# Patient Record
Sex: Female | Born: 1935 | Race: White | Hispanic: No | Smoking: Never smoker
Health system: Southern US, Community
[De-identification: ages and names within clinical notes are randomized; demographics above are authoritative.]

## PROBLEM LIST (undated history)

## (undated) DIAGNOSIS — M1990 Unspecified osteoarthritis, unspecified site: Secondary | ICD-10-CM

## (undated) DIAGNOSIS — C189 Malignant neoplasm of colon, unspecified: Secondary | ICD-10-CM

## (undated) DIAGNOSIS — I10 Essential (primary) hypertension: Secondary | ICD-10-CM

## (undated) HISTORY — PX: COLON SURGERY: SHX602

---

## 2000-07-02 ENCOUNTER — Other Ambulatory Visit: Admission: RE | Admit: 2000-07-02 | Discharge: 2000-07-02 | Payer: Self-pay | Admitting: *Deleted

## 2002-11-10 ENCOUNTER — Inpatient Hospital Stay (HOSPITAL_COMMUNITY): Admission: AD | Admit: 2002-11-10 | Discharge: 2002-11-24 | Payer: Self-pay | Admitting: Internal Medicine

## 2002-11-11 ENCOUNTER — Encounter: Payer: Self-pay | Admitting: Internal Medicine

## 2002-11-13 ENCOUNTER — Encounter: Payer: Self-pay | Admitting: General Surgery

## 2002-11-13 ENCOUNTER — Encounter (INDEPENDENT_AMBULATORY_CARE_PROVIDER_SITE_OTHER): Payer: Self-pay | Admitting: *Deleted

## 2002-11-14 ENCOUNTER — Encounter: Payer: Self-pay | Admitting: General Surgery

## 2002-11-16 ENCOUNTER — Encounter: Payer: Self-pay | Admitting: Internal Medicine

## 2002-11-16 ENCOUNTER — Encounter (INDEPENDENT_AMBULATORY_CARE_PROVIDER_SITE_OTHER): Payer: Self-pay | Admitting: *Deleted

## 2002-11-17 ENCOUNTER — Encounter: Payer: Self-pay | Admitting: Internal Medicine

## 2002-11-24 ENCOUNTER — Inpatient Hospital Stay: Admission: RE | Admit: 2002-11-24 | Discharge: 2002-12-01 | Payer: Self-pay | Admitting: *Deleted

## 2002-11-29 ENCOUNTER — Encounter: Payer: Self-pay | Admitting: Internal Medicine

## 2003-01-07 ENCOUNTER — Encounter: Admission: RE | Admit: 2003-01-07 | Discharge: 2003-01-07 | Payer: Self-pay | Admitting: General Surgery

## 2003-01-07 ENCOUNTER — Encounter: Payer: Self-pay | Admitting: General Surgery

## 2004-08-01 ENCOUNTER — Ambulatory Visit: Payer: Self-pay | Admitting: Hematology & Oncology

## 2005-02-23 ENCOUNTER — Ambulatory Visit: Payer: Self-pay | Admitting: Hematology & Oncology

## 2005-08-24 ENCOUNTER — Ambulatory Visit: Payer: Self-pay | Admitting: Hematology & Oncology

## 2005-10-10 ENCOUNTER — Encounter: Payer: Self-pay | Admitting: General Surgery

## 2006-02-21 ENCOUNTER — Ambulatory Visit: Payer: Self-pay | Admitting: Hematology & Oncology

## 2006-02-25 LAB — CBC WITH DIFFERENTIAL/PLATELET
Eosinophils Absolute: 0.3 10*3/uL (ref 0.0–0.5)
MCV: 90.8 fL (ref 81.0–101.0)
MONO%: 9 % (ref 0.0–13.0)
NEUT#: 4.8 10*3/uL (ref 1.5–6.5)
RBC: 3.99 10*6/uL (ref 3.70–5.32)
RDW: 13.5 % (ref 11.3–14.5)
WBC: 7.5 10*3/uL (ref 3.9–10.0)

## 2006-02-25 LAB — COMPREHENSIVE METABOLIC PANEL
AST: 18 U/L (ref 0–37)
Albumin: 4.2 g/dL (ref 3.5–5.2)
Alkaline Phosphatase: 67 U/L (ref 39–117)
Glucose, Bld: 98 mg/dL (ref 70–99)
Potassium: 4.4 mEq/L (ref 3.5–5.3)
Sodium: 129 mEq/L — ABNORMAL LOW (ref 135–145)
Total Protein: 6.8 g/dL (ref 6.0–8.3)

## 2006-03-19 ENCOUNTER — Ambulatory Visit: Payer: Self-pay | Admitting: Gastroenterology

## 2006-03-26 ENCOUNTER — Ambulatory Visit: Payer: Self-pay | Admitting: Gastroenterology

## 2006-03-28 ENCOUNTER — Ambulatory Visit (HOSPITAL_COMMUNITY): Admission: RE | Admit: 2006-03-28 | Discharge: 2006-03-28 | Payer: Self-pay | Admitting: Gastroenterology

## 2006-08-27 ENCOUNTER — Ambulatory Visit: Payer: Self-pay | Admitting: Hematology & Oncology

## 2006-08-29 LAB — COMPREHENSIVE METABOLIC PANEL
ALT: 9 U/L (ref 0–35)
Alkaline Phosphatase: 52 U/L (ref 39–117)
Sodium: 140 mEq/L (ref 135–145)
Total Bilirubin: 0.5 mg/dL (ref 0.3–1.2)
Total Protein: 6.8 g/dL (ref 6.0–8.3)

## 2006-08-29 LAB — CBC WITH DIFFERENTIAL/PLATELET
BASO%: 0.3 % (ref 0.0–2.0)
LYMPH%: 12.3 % — ABNORMAL LOW (ref 14.0–48.0)
MCH: 29.3 pg (ref 26.0–34.0)
MCHC: 34 g/dL (ref 32.0–36.0)
MCV: 86.2 fL (ref 81.0–101.0)
MONO%: 5.9 % (ref 0.0–13.0)
Platelets: 351 10*3/uL (ref 145–400)
RBC: 4.08 10*6/uL (ref 3.70–5.32)

## 2007-08-26 ENCOUNTER — Ambulatory Visit: Payer: Self-pay | Admitting: Hematology & Oncology

## 2007-08-28 LAB — COMPREHENSIVE METABOLIC PANEL
AST: 15 U/L (ref 0–37)
Albumin: 3.8 g/dL (ref 3.5–5.2)
BUN: 15 mg/dL (ref 6–23)
CO2: 25 mEq/L (ref 19–32)
Calcium: 9.6 mg/dL (ref 8.4–10.5)
Chloride: 97 mEq/L (ref 96–112)
Glucose, Bld: 96 mg/dL (ref 70–99)
Potassium: 4.6 mEq/L (ref 3.5–5.3)

## 2007-08-28 LAB — CBC WITH DIFFERENTIAL/PLATELET
Basophils Absolute: 0 10*3/uL (ref 0.0–0.1)
Eosinophils Absolute: 0.3 10*3/uL (ref 0.0–0.5)
HGB: 9.8 g/dL — ABNORMAL LOW (ref 11.6–15.9)
MONO#: 0.6 10*3/uL (ref 0.1–0.9)
NEUT#: 4.8 10*3/uL (ref 1.5–6.5)
RDW: 13.3 % (ref 11.3–14.5)
lymph#: 1.2 10*3/uL (ref 0.9–3.3)

## 2010-10-27 NOTE — Consult Note (Signed)
NAMEFARRIS, BLASH NO.:  [id number]   MEDICAL RECORD NO.:  [id number]                   PATIENT TYPE:  INP   LOCATION:  5157                                 FACILITY:  [facility]   PHYSICIAN:  Adolph Pollack, M.D.            DATE OF BIRTH:  1921-02-01   DATE OF CONSULTATION:  11/12/2002  DATE OF DISCHARGE:                                   CONSULTATION   REASON FOR VISIT:  Colon mass.   HISTORY OF PRESENT ILLNESS:  Ms. Zynda is a 76 year old female who noticed  a change in her bowel habits about a month ago.  She said she began having  diarrhea and some problems with constipation.  She also had intermittent  abdominal pain.  She took Imodium AD a number of occasions so she could go  out and do the things she likes to do.  She has also had somewhat of a  weight loss and anorexia as well as increase in fatigue.  She is felt to be  severely constipated and outpatient treatment was attempted without success  and she subsequently was admitted as the KUB showed significant increased  stool in the colon.  While in the hospital, she did get some results to her  enemas but then was noted to have a persistent left lower quadrant mass and  a CT scan was done which demonstrated a large mass what appeared to be in  the descending/sigmoid colon region.  Appeared to be partially obstructed.  It was for this reason that I was called to see her.   PAST MEDICAL HISTORY:  1. Hypertension.  2. Peptic ulcer disease.   PREVIOUS OPERATIONS:  1. Tonsillectomy.  2. Adenoidectomy.   ALLERGIES:  1. SULFA MEDICATIONS  2. FLU VACCINE.   CURRENT MEDICATIONS:  1. Tenormin 100 mg daily.  2. Multivitamin.   SOCIAL HISTORY:  She occasionally smoked in the distant past. No alcohol  use.  She is a widow and retired.   FAMILY HISTORY:  Father died from what was felt to be heart disease.  There  is no family history of any type of malignancy.  No diabetes or  hypertension  in the family to her knowledge.   REVIEW OF SYSTEMS:  She denies any known heart problems.  GI:  She has not  had any further problems with peptic ulcer disease.  She was seen by Dr.  Sheryn Bison in the past and had upper and lower endoscopies but this was  somewhere in the late 70s to early 80s.  Constitutional: She has had an  increase in some fatigue and loss of appetite as well as some weight loss.   PHYSICAL EXAMINATION:  GENERAL:  She is an elderly female in no acute  distress.  Very pleasant and cooperative.  HEENT:  Eyes:  Extraocular motions are intact.  No icterus.  SKIN:  Warm and dry without jaundice.  NECK:  Supple without palpable masses or obvious thyroid enlargement.  CARDIOVASCULAR:  Heart demonstrates a regular  rate and rhythm.  RESPIRATORY:  Breath sounds equal and clear.  Respirations nonlabored.  ABDOMEN:  Soft with a palpable mass in the left lower quadrant, slightly  tender. Active bowel sounds noted.  No organomegaly or hernias noted.  EXTREMITIES:  She has severely good muscle tone and no edema.   LABORATORY DATA:  CBC demonstrates a hemoglobin of 9.0, platelet count  459,000. White count 7200, albumin 2.6, rest of liver function tests normal.  CT scan was reviewed which demonstrates the mass in the distal  descending/proximal sigmoid colon region with what appears to be some  adenopathy in the aorta.  There appeared to be some cystic liver lesions as  well.  Contrast does pass through the mass.   IMPRESSION:  Colonic mass, most likely adenocarcinoma.  It is not clearly  characterized as of yet.   PLAN:  I have called [facility] for consultation and for  colonoscopy.  I did discuss the patient the possible need for partial  colectomy plus/minus colostomy.  We went over the procedure and the  rationale for it.  She is not sure she wants to have anything done, but she  is agreeable to having a colonoscopy at this time.  We will  await for the  results of that before we decide and recommend further management.                                                  Adolph Pollack, M.D.    Kari Baars  D:  11/12/2002  T:  11/12/2002  Job:  [id number]   cc:   Venita Lick. Russella Dar, M.D. [facility]   Lilla Shook, M.D.  [address], [address]  [place]  [place]  [address]  Fax: [phone]

## 2010-10-27 NOTE — Op Note (Signed)
Maureen Weiss, Maureen Weiss NO.:  [id number]   MEDICAL RECORD NO.:  [id number]                   PATIENT TYPE:  INP   LOCATION:  2112                                 FACILITY:  [facility]   PHYSICIAN:  Ollen Gross. Vernell Morgans, M.D.              DATE OF BIRTH:  12-04-1920   DATE OF PROCEDURE:  11/16/2002  DATE OF DISCHARGE:                                 OPERATIVE REPORT   PREOPERATIVE DIAGNOSIS:  Obstructing sigmoid colon mass.   POSTOPERATIVE DIAGNOSIS:  Obstructing sigmoid colon mass.   OPERATION PERFORMED:  Exploratory laparotomy, sigmoid colectomy with  mobilization of the splenic flexure.  Bilateral salpingo-oophorectomy and  rigid sigmoidoscopy.   SURGEON:  Ollen Gross. Carolynne Edouard, M.D.   ASSISTANT:  Donnie Coffin. Samuella Cota, M.D.   ANESTHESIA:  General endotracheal.   DESCRIPTION OF PROCEDURE:  After informed consent was obtained, the patient  was brought to the operating room and placed in supine position on the  operating table.  After adequate induction of general anesthesia, the  patient was placed in lithotomy position.  A rigid sigmoidoscopy was  performed and the scope was able to be passed to a depth of 20 cm with no  visualization of tumor or narrowing.  At this point the scope was removed.  The patient was placed back in supine position.  The patient's abdomen was  prepped with Betadine and draped in the usual sterile manner.  A midline  incision was made with a 10 blade knife. This incision was carried down  through the skin and subcutaneous tissue sharply with the electrocautery  until the linea alba was identified.  The linea alba was also incised with  the electrocautery.  The preperitoneal space was then probed bluntly with a  hemostat until the peritoneum was opened and access was gained to the  abdominal cavity.  The rest of the incision was then opened under direct  vision.  The abdomen was inspected.  There was no evidence of peritoneal  metastases or  omental metastases.  The liver was palpated and there were a  few small specks on the liver which on visualization looked like benign  hemangiomas or cysts.  Attention was then turned to the left lower quadrant.  The large tumor could easily be appreciated and palpated involving the  descending sigmoid colon.  This colon was mobilized from its retroperitoneal  attachments by incising along the white line of Toldt.  Part of the  retroperitoneal wall was adhered to the tumor and was excised with the  tumor.  Once this was complete, the tumor itself was mobilized and able to  be brought up into the wound.  There was plenty of distal colon left but the  proximal colon had been mobilized so that it would reach down into the  pelvis for anastomosis.  The incision was carried superiorly a little  farther and then the superior colon  was mobilized from it retroperitoneal  attachments also by incising along the white line of Toldt.  The omentum was  freed.  The splenic flexure was taken down bluntly with finger dissection  and sharply with Bovie dissection.  The omentum was incised.  The omentum  attached to this portion of the colon was separated by serially clamping,  dividing and ligating with 2-0 silk ties.  Once this was all accomplished  and the splenic flexure was mobilized and brought down into the wound, a  site was chosen on the transverse colon for division of the bowel.  The  mesentery was opened sharply with the electrocautery.  A GIA-75 stapler was  placed across the colon, clamped and fired there by dividing the colon  between two sets of staple lines.  The site was then chosen distally a good  margin from the primary tumor for division of the bowel distally and the  mesentery was opened sharply with the electrocautery.  GIA-75 stapler was  placed across the bowel at this point, clamped and fired there by dividing  the bowel between two sets of staple lines.  The mesentery supplying  the  segment of bowel involving the tumor was then taken down by serially  clamping with Kelly clamps dividing and ligating the main vessels of the  mesentery with 2-0 silk ties.  The bigger vessels were doubly ligated with 2-  0 silk ties.  Once this was accomplished, the tumor was able to be removed  from the patient and sent to pathology for further evaluation.  The proximal  and distal segments of colon were easily reapproximated.  They were held in  approximation using a 3-0 silk anchoring stitch, small antimesenteric  enterotomies were made with the Bovie electrocautery.  Each limb of a  stapling device was placed down each limb of the bowel.  The antimesenteric  borders of the bowel were reapproximated this way.  Stapling device was  clamped and fired thereby creating a nice long widely patent  enteroenterostomy.  The enterotomy was closed with interrupted 3-0 silk  stitches.  The mesentery was closed with interrupted 3-0 silk stitches as  well.  The anastomosis appeared to widely patent and intact.  A crotch  stitch with 3-0 silk was also placed to take the tension off the staple  line.  The patient tolerated the procedure well.  At this point gloves were  changed and the abdomen was irrigated with copious amounts of saline.  It  should also be mentioned that during the dissection on the left, the left  tube and ovary was adherent to the tumor and the left tube and ovary were  mobilized along the broad ligament, clamped with Kelly clamps, divided and  then ligated with 2-0 silk suture ligatures and removed with the specimen.  The right tube and ovary were also mobilized along the broad ligament and  then clamped with a Kelly clamp, divided and ligated with 2-0 silk suture  ligatures.  Again, the abdomen was then irrigated with copious amounts of  saline.  There appeared to be some clot in the left upper quadrant and the left upper quadrant was inspected again.  There appeared to be  a small  bleeding vessel along the peritoneal edge which was controlled with large  hemostatic clips and some interrupted 3-0 silk suture ligatures.  Once this  was accomplished, the left upper quadrant was inspected again and found to  be hemostatic.  The fascia of the  anterior abdominal was closed with two  running #1 PDS sutures.  The subcutaneous tissue was irrigated with saline  and the skin was closed with staples.  The patient tolerated the procedure  well. At the end of the case all sponge, needle and instrument counts were  correct.  The patient was then awakened and taken to recovery room in stable  condition.                                                Ollen Gross. Vernell Morgans, M.D.    PST/MEDQ  D:  11/17/2002  T:  11/17/2002  Job:  [id number]

## 2010-10-27 NOTE — Discharge Summary (Signed)
NAMEPEARLE, WANDLER NO.:  [id number]   MEDICAL RECORD NO.:  [id number]                   PATIENT TYPE:  INP   LOCATION:  5733                                 FACILITY:  [facility]   PHYSICIAN:  Lilla Shook, M.D.            DATE OF BIRTH:  June 03, 1921   DATE OF ADMISSION:  11/10/2002  DATE OF DISCHARGE:  11/24/2002                                 DISCHARGE SUMMARY   DISCHARGE DIAGNOSES:  1. Constipation.  2. Obstructing sigmoid colonic mass.  Pathology showed invasive     adenocarcinoma extending beneath muscularis propria into subserosal     tissue, 18 lymph nodes negative for metastatic carcinoma, and there was     no definitive lymphovascular invasion identified.  3. Hypertension.   DISCHARGE MEDICATIONS:  1. Atenolol 100 mg daily.  2. Vicodin 5/500 mg q.4-6h. p.r.n. (1 or 1 tablets).  3. Tylenol 650 mg p.r.n./q.6h. p.r.n.  4. Zofran 2 to 4 mg IV q.2-3h. p.r.n.  5. Labetalol 10 mg IV q.4h. p.r.n.  6. Lisinopril 10 mg p.o. daily.  7. __________ p.r.n.   CHIEF COMPLAINT:  Constipation.   HISTORY OF PRESENT ILLNESS:  The patient is a pleasant 77 year old woman  with a past medical history of hypertension.  She presented with complaints  of constipation x 30 days.  She states she had diarrhea for two to three  weeks and had no solid stools, no fevers or chills.  She has taken Imodium  AD to help on several occasions.  She had abdominal pain, nausea, anorexia  and evidently had decreased in weight.  I saw her at least once in my office  previously for this.  She lost 5 pounds in the past four days.  She was  taking outpatient therapy with MiraLax but without good results and also did  not take the Lactulose given to her.  She performed two Fleet's enemas in  the past two to three days.  She was admitted, was impacted, and will need  further workup.  She had a KUB Nov 06, 2002, showing increased amount of  stool.   PAST MEDICAL HISTORY:   Hypertension.   ALLERGIES:  SULFA and FLU VACCINE.   PHYSICAL EXAMINATION:  VITAL SIGNS:  Temperature 98, blood pressure 110/62,  pulse 76, weight 119(she was 124 four days earlier).  GENERAL:  She was in mild distress, holding her abdomen.  LUNGS:  Clear bilaterally.  CARDIOVASCULAR:  Normal.  ABDOMEN:  Flat with good bowel sounds and soft.  Several masses of what was  felt to be stool in left upper quadrant.  No tenderness, no distention, no  rebound or guarding.  RECTAL:  She had brown stool in the rectal vault that was heme negative.  EXTREMITIES:  Normal.  NEUROLOGIC:  Normal.   LABORATORY DATA:  Sodium 136, potassium 3.4, glucose 207, BUN 10, creatinine  0.9, total protein 5.8, albumin 2.6.  LFTs  were normal.   HOSPITAL COURSE:  Ms. Bordner was admitted and placed on full liquid diet  until she had a bowel movement.  She was given IV fluids, Fleet's enema  preps and soap suds enemas.  A CT scan of her abdomen was performed showing  a large hiatal hernia, several small lesions on the liver some of which  appeared cystic.  There was no bowel obstruction, no adenopathy, but a large  soft tissue mass in the left pelvis emanating from the juncture of the  sigmoid and descending colon.  There was newly obstructive flow at that  level to contrast.  The mass was 5.5 x 7.9 cm.  There was an adjacent  necrotic node laterally without perforation of free fluid.  The GI service  was consulted, and Dr. Russella Dar performed a colonoscopy on Nov 13, 2002.  This  was remarkable for diverticulosis and almost completely obstructed sigmoid  colon with apple core lesion.  Dr. Abbey Chatters was consulted and MRI was  obtained to evaluate for possible metastasis.  MRI was negative for this.  CEA level was normal.   She consented for surgery on November 16, 2002.  Had a sigmoid colectomy with  mobilization of the splenic flexure.  Bilateral salpingo-oophorectomy, and  rigid sigmoidoscopy were also performed by  Dr. Chevis Pretty.   Her postoperative course was remarkable only for reintubation likely because  she was very drowsy.  She was promptly extubated the next day as well.  She  moved from intensive care to the regular floor.  She developed a  temperature, and Zosyn was initially started because of question of basilar  infiltrates, but on discontinuation, she did well.   Dr. Filbert Berthold was consulted for an oncology opinion.  She did not need adjuvant  chemotherapy was recommendation.  She needed colonoscopy within one year of  surgery, CT of abdomen every 6 months x 4 and every year x 3 with LFTs and  CEA performed each time.   Ms. Tolliver was transferred to [facility] shortly after her surgery.  She tolerated  Lisinopril in addition to Tenormin for blood pressure.   CONDITION ON DISCHARGE:  Stable and improved.   CONSULTATIONS:  1. Adolph Pollack, M.D.  2. Ollen Gross. Carolynne Edouard, M.D.  3. Enzo Montgomery. Filbert Berthold, M.D.                                                Lilla Shook, M.D.    SEJ/MEDQ  D:  01/07/2003  T:  01/07/2003  Job:  [id number]

## 2010-10-27 NOTE — Assessment & Plan Note (Signed)
[facility]                           GASTROENTEROLOGY OFFICE NOTE   GEORGENA, Weiss                       MRN:          [id number]  DATE:03/19/2006                            DOB:          10-22-1920    REFERRING PHYSICIAN:  Rose Phi. Myna Hidalgo, M.D.   REASON FOR REFERRAL:  Personal history of colon cancer.  Maureen Weiss is an  77 year old white female whom I have previously evaluated.  She underwent a  colonoscopy in June of 2004 for anemia and an abnormal CT scan suspicious  for sigmoid colon mass.  An obstructing adenocarcinoma of the sigmoid colon  was noted, and she underwent a sigmoid colectomy by Dr. Carolynne Edouard on November 16, 2002.  Final pathology was a T3N0MX adenocarcinoma of the sigmoid colon.  No  adjuvant chemotherapy was given.  She has had ongoing followup with Dr.  Myna Hidalgo and Dr. Kirby Funk.  I have an office note from Dr. Myna Hidalgo dated  August 27, 2005, and her disease was felt to be stable at that point, and  adjuvant chemotherapy was deferred.  She states she has had a recent checkup  with Dr. Myna Hidalgo and Dr. Valentina Lucks, and she was encouraged to keep her  recommended appointment for colonoscopy.  They did send a letter to her in  2006, at 2 years following her colonoscopy, to recommend she have a repeat  colonoscopy, but she did not return.  She states she did receive the letter.  She has been encouraged by Dr. Myna Hidalgo and Dr. Valentina Lucks to proceed with  colonoscopy at this time.  She has no abdominal complaints whatsoever and  specifically denies any abdominal pain, weight loss, change in bowel habits,  change in stool caliber, melena, or hematochezia.   PAST MEDICAL HISTORY:  T3N0MX adenocarcinoma of the sigmoid colon status  post sigmoid colectomy, status post bilateral salpingo-oophorectomy,  hypertension.   CURRENT MEDICATIONS:  1. Tenormin 100 mg q. day.  2. Hydrochlorothiazide 12.5 mg q. day.  3. Multivitamin q. day.  4. Tylenol  p.r.n.   MEDICATION ALLERGIES:  SULFA DRUGS.   SOCIAL HISTORY:  She is widowed with 1 child.  She lives by herself.  Her  son resides in [place].  She denies alcohol and tobacco product usage.   REVIEW OF SYSTEMS:  Entirely negative.   PHYSICAL EXAM:  No acute distress.  Height 5 feet 3 inches, weight 127 pounds.  Blood pressure is 140/80, pulse  72 and regular.  HEENT:  Anicteric sclerae.  Oropharynx clear.  CHEST:  Clear to auscultation bilaterally.  CARDIAC:  Regular rate and rhythm without murmurs appreciated.  ABDOMEN:  Soft and nontender.  Nondistended.  Normoactive bowel sounds.  No  palpable organomegaly, masses, or hernias.  RECTAL:  Examination deferred to time of colonoscopy.  EXTREMITIES:  Without cyanosis, clubbing, or edema.  NEUROLOGIC:  Alert and oriented x3.  Grossly nonfocal.   ASSESSMENT AND PLAN:  Personal history of sigmoid colon adenocarcinoma  status post sigmoid colectomy in June of 2004.  Need to exclude recurrent  colorectal neoplasms.  Risks, benefits, and alternatives to colonoscopy with  possible biopsy and possible polypectomy discussed with the patient.  She  consents to proceed.  This will be scheduled electively.  She clearly states  this will be her last colonoscopy.  Ongoing followup with Dr. Myna Hidalgo and  Dr. Valentina Lucks.       Maureen Weiss. Russella Dar, MD, Clementeen Graham      MTS/MedQ  DD:  03/19/2006  DT:  03/21/2006  Job #:  [id number]   cc:   Thora Lance, M.D.  Rose Phi. Myna Hidalgo, M.D.

## 2011-09-08 ENCOUNTER — Emergency Department (HOSPITAL_COMMUNITY): Payer: Medicare Other

## 2011-09-08 ENCOUNTER — Emergency Department (HOSPITAL_COMMUNITY)
Admission: EM | Admit: 2011-09-08 | Discharge: 2011-09-08 | Disposition: A | Discharge status: Home / Self Care | Payer: Medicare Other | Attending: Emergency Medicine | Admitting: Emergency Medicine

## 2011-09-08 ENCOUNTER — Encounter (HOSPITAL_COMMUNITY): Payer: Self-pay

## 2011-09-08 DIAGNOSIS — M1610 Unilateral primary osteoarthritis, unspecified hip: Secondary | ICD-10-CM | POA: Insufficient documentation

## 2011-09-08 DIAGNOSIS — S329XXA Fracture of unspecified parts of lumbosacral spine and pelvis, initial encounter for closed fracture: Secondary | ICD-10-CM | POA: Insufficient documentation

## 2011-09-08 DIAGNOSIS — R0602 Shortness of breath: Secondary | ICD-10-CM | POA: Insufficient documentation

## 2011-09-08 DIAGNOSIS — R109 Unspecified abdominal pain: Secondary | ICD-10-CM | POA: Insufficient documentation

## 2011-09-08 DIAGNOSIS — M25459 Effusion, unspecified hip: Secondary | ICD-10-CM | POA: Insufficient documentation

## 2011-09-08 DIAGNOSIS — X58XXXA Exposure to other specified factors, initial encounter: Secondary | ICD-10-CM | POA: Insufficient documentation

## 2011-09-08 DIAGNOSIS — M169 Osteoarthritis of hip, unspecified: Secondary | ICD-10-CM | POA: Insufficient documentation

## 2011-09-08 DIAGNOSIS — R079 Chest pain, unspecified: Secondary | ICD-10-CM | POA: Insufficient documentation

## 2011-09-08 DIAGNOSIS — I10 Essential (primary) hypertension: Secondary | ICD-10-CM | POA: Insufficient documentation

## 2011-09-08 HISTORY — DX: Malignant neoplasm of colon, unspecified: C18.9

## 2011-09-08 HISTORY — DX: Essential (primary) hypertension: I10

## 2011-09-08 HISTORY — DX: Unspecified osteoarthritis, unspecified site: M19.90

## 2011-09-08 MED ORDER — HYDROCODONE-ACETAMINOPHEN 5-325 MG PO TABS: 1.0000 | ORAL_TABLET | Freq: Four times a day (QID) | ORAL | Status: DC | PRN

## 2011-09-08 MED ORDER — HYDROCODONE-ACETAMINOPHEN 5-325 MG PO TABS: 1.0000 | ORAL_TABLET | Freq: Four times a day (QID) | ORAL | Status: AC | PRN

## 2011-09-08 NOTE — ED Notes (Signed)
Pt moved to [facility] , waiting for PTAR

## 2011-09-08 NOTE — ED Provider Notes (Addendum)
History     CSN: [id number]  Arrival date & time 09/08/11  1037   First MD Initiated Contact with Patient 09/08/11 1157      Chief Complaint  Patient presents with  . Groin Pain    (Consider location/radiation/quality/duration/timing/severity/associated sxs/prior treatment) Patient is a 78 y.o. female presenting with hip pain. The history is provided by the patient and the spouse.  Hip Pain This is a new problem. The problem occurs constantly. The problem has not changed since onset.Associated symptoms include chest pain. Pertinent negatives include no abdominal pain, no headaches and no shortness of breath. The symptoms are aggravated by nothing. The symptoms are relieved by nothing.   patient with fall back in February seen by her primary care provider on March 6 had x-rays of the hip which was negative today with increased right groin pain no new fall. Patient also did hit her chest with her cane and couple days ago and has some midsternal tenderness to the chest. No other complaints.  Groin pain is listed currently as about an 8/10 at its worse with walking. It does not radiate. Described as sharp. Past Medical History  Diagnosis Date  . Hypertension   . Osteoarthritis   . Colon cancer     Past Surgical History  Procedure Date  . Colon surgery     No family history on file.  History  Substance Use Topics  . Smoking status: Never Smoker   . Smokeless tobacco: Not on file  . Alcohol Use: No    OB History    Grav Para Term Preterm Abortions TAB SAB Ect Mult Living                  Review of Systems  Constitutional: Negative for fever and chills.  HENT: Negative for neck pain.   Eyes: Negative for visual disturbance.  Respiratory: Negative for shortness of breath.   Cardiovascular: Positive for chest pain.  Gastrointestinal: Negative for nausea, vomiting, abdominal pain and diarrhea.  Genitourinary: Negative for dysuria.  Musculoskeletal: Negative for back  pain.  Skin: Negative for rash.  Neurological: Negative for weakness and headaches.  Hematological: Does not bruise/bleed easily.    Allergies  Sulfa antibiotics  Home Medications   Current Outpatient Rx  Name Route Sig Dispense Refill  . AMLODIPINE BESYLATE 2.5 MG PO TABS Oral Take 2.5 mg by mouth daily.    . ATENOLOL 100 MG PO TABS Oral Take 100 mg by mouth daily.    Marland Kitchen HYDROCODONE-ACETAMINOPHEN 5-325 MG PO TABS Oral Take 1 tablet by mouth every 6 (six) hours as needed.    . ADULT MULTIVITAMIN W/MINERALS CH Oral Take 1 tablet by mouth daily.    Marland Kitchen HYDROCODONE-ACETAMINOPHEN 5-325 MG PO TABS Oral Take 1-2 tablets by mouth every 6 (six) hours as needed for pain. 14 tablet 0    BP 164/66  Pulse 74  Temp(Src) 99.2 F (37.3 C) (Oral)  Resp 18  SpO2 95%  Physical Exam  Nursing note and vitals reviewed. Constitutional: She is oriented to person, place, and time. She appears well-developed and well-nourished. No distress.  HENT:  Head: Normocephalic and atraumatic.  Mouth/Throat: Oropharynx is clear and moist.  Eyes: Conjunctivae are normal. Pupils are equal, round, and reactive to light.  Neck: Normal range of motion. Neck supple.  Cardiovascular: Normal rate, regular rhythm, normal heart sounds and intact distal pulses.   No murmur heard. Pulmonary/Chest: Effort normal and breath sounds normal. No respiratory distress.  Abdominal: Soft. Bowel  sounds are normal. There is no tenderness.  Musculoskeletal: Normal range of motion. She exhibits no edema and no tenderness.       No deformity to function 2+ dorsalis pedis pulse. the right hip no leg shortening pain with range of motion passively good distal neural  Neurological: She is alert and oriented to person, place, and time. No cranial nerve deficit. She exhibits normal muscle tone. Coordination normal.  Skin: Skin is warm. No rash noted.    ED Course  Procedures (including critical care time)  Labs Reviewed - No data to  display Dg Chest 2 View  09/08/2011  *RADIOLOGY REPORT*  Clinical Data: Shortness of breath  CHEST - 2 VIEW  Comparison: None  Findings: The heart size appears mildly enlarged.  There are small bilateral pleural effusions and pulmonary venous congestion.  No overt edema.  No airspace consolidation is identified.  The patient has a hiatal hernia.  Lung volumes are low and there is asymmetric elevation of the right hemidiaphragm.  IMPRESSION: 1.  Small effusions and pulmonary venous congestion. 2.  Hiatal hernia.  Original Report Authenticated By: Rosealee Albee, M.D.   Ct Hip Right Wo Contrast  09/08/2011  *RADIOLOGY REPORT*  Clinical Data: All March 6.  Sudden onset right groin pain yesterday.  Limited range of motion of the right leg.  CT OF THE RIGHT HIP WITHOUT CONTRAST  Technique:  Multidetector CT imaging was performed according to the standard protocol. Multiplanar CT image reconstructions were also generated.  Comparison: None.  Findings: There is a nondisplaced comminuted fracture of the anterior right inferior pubic ramus.  The fracture plane extends into the parasymphyseal pubis.  There is mild associated hematoma and edema extending into the adductor compartment.  The root of the right superior pubic ramus appears intact.  Moderate right hip osteoarthritis is present with acetabular subchondral cyst.  The right sacrum is incompletely visualized.  At the superior margin of the scan, there appears to be a right sacral ala fracture that would complement the pubic bone fracture.  Swelling of the right proximal adductor musculature is suspected, compatible with intramuscular hematoma.  Iliofemoral atherosclerosis is noted. Calcified phleboliths are present in the anatomic pelvis.  The femoral neck appears intact.  Common hamstring origins appear normal.  IMPRESSION: 1.  Comminuted nondisplaced right pubic bone fracture extending from parasymphyseal pubis into the anterior right inferior pubic ramus. 2.   Partially visualized probable right sacral ala fracture seen at the margin of the scan.  This would this would complement the right pubic fracture. 3.  Moderate right hip osteoarthritis.  Small right hip effusion.  Original Report Authenticated By: Andreas Newport, M.D.     1. Pelvic fracture       MDM  CT scan reveals a right pelvic fracture no hip fracture. Assume that this occurred with a fall that happened back in the end of February do not think this is acute. But either way of treatment will be pain control and followup patient is able to ambulate with this.        Shelda Jakes, MD 09/08/11 1529  Shelda Jakes, MD 09/08/11 [phone]

## 2011-09-08 NOTE — ED Notes (Signed)
MVH:[id number]<GE> Expected date:09/08/11<BR> Expected time:10:33 AM<BR> Means of arrival:Ambulance<BR> Comments:<BR> S/p fall

## 2011-09-08 NOTE — ED Notes (Signed)
Per ems, pt fell on March 6th, went to PCP and xray were done, sts everything was normal. Haven't been experiencing any symptoms. Pt came today c/o sudden right groin pain, onset yesterday, denied any injury to the area. Limited ROM on the right leg, right groin area tender to touch.

## 2011-09-08 NOTE — ED Notes (Signed)
Pt will be going home by [facility]. [facility] called

## 2011-09-08 NOTE — ED Notes (Signed)
[facility] AT BEDSIDE. 

## 2011-09-08 NOTE — Discharge Instructions (Signed)
Pelvic Fracture  Pelvic fractures are usually due to a fall or car accident. Minor fractures of the pelvic bones can often be treated at home. Walking or changing positions may be painful. You should rest for several days but then begin weight bearing and walking as tolerated. Crutches or a walker are often used in the first few weeks to reduce pain and enable you to get around easier. Prolonged bed rest for a pelvic fracture is not recommended. It increases your risk for blood clots and other complications.  Pelvic fractures usually heal in 6-12 weeks without any special treatment. Treatment may include pain medicine, medicine to keep your stool soft, and crutches or a walker.   Take these simple measures to avoid further falls and injuries:   Get rid of your throw rugs and electrical cords from traveled areas.   Avoid poorly lit stairs.   Do not wear high heel shoes.  If you are at risk for osteoporosis, treatment includes regular exercise, a diet rich in calcium and vitamin D, and possibly other drugs to help preserve bone. Ask your primary care physician if you should have a bone density test (DEXA scan) if you are 50 years or older.  SEEK IMMEDIATE MEDICAL CARE IF:  You develop blood in your urine, increased pain, severe constipation, increasing difficulty walking, pain or unusual swelling in your legs, chest pain, fever, shortness of breath, or if you faint or fall.   MAKE SURE YOU:    Understand these instructions.   Will watch your condition.   Will get help right away if you are not doing well or get worse.  Document Released: 07/05/2004 Document Revised: 05/17/2011 Document Reviewed: 09/15/2008  [employer] Patient Information 2012 [employer]

## 2011-09-12 ENCOUNTER — Inpatient Hospital Stay (HOSPITAL_COMMUNITY)
Admission: EM | Admit: 2011-09-12 | Discharge: 2011-09-17 | DRG: 536 | Disposition: A | Discharge status: Skilled Nursing Facility | Payer: Medicare Other | Attending: Internal Medicine | Admitting: Internal Medicine

## 2011-09-12 ENCOUNTER — Other Ambulatory Visit: Payer: Self-pay

## 2011-09-12 ENCOUNTER — Encounter (HOSPITAL_COMMUNITY): Payer: Self-pay

## 2011-09-12 DIAGNOSIS — D509 Iron deficiency anemia, unspecified: Secondary | ICD-10-CM | POA: Diagnosis present

## 2011-09-12 DIAGNOSIS — L89302 Pressure ulcer of unspecified buttock, stage 2: Secondary | ICD-10-CM

## 2011-09-12 DIAGNOSIS — W19XXXA Unspecified fall, initial encounter: Secondary | ICD-10-CM

## 2011-09-12 DIAGNOSIS — Z85038 Personal history of other malignant neoplasm of large intestine: Secondary | ICD-10-CM

## 2011-09-12 DIAGNOSIS — I10 Essential (primary) hypertension: Secondary | ICD-10-CM | POA: Diagnosis present

## 2011-09-12 DIAGNOSIS — S3210XA Unspecified fracture of sacrum, initial encounter for closed fracture: Secondary | ICD-10-CM | POA: Diagnosis present

## 2011-09-12 DIAGNOSIS — L98499 Non-pressure chronic ulcer of skin of other sites with unspecified severity: Secondary | ICD-10-CM | POA: Diagnosis present

## 2011-09-12 DIAGNOSIS — Z888 Allergy status to other drugs, medicaments and biological substances status: Secondary | ICD-10-CM

## 2011-09-12 DIAGNOSIS — R52 Pain, unspecified: Secondary | ICD-10-CM

## 2011-09-12 DIAGNOSIS — Z79899 Other long term (current) drug therapy: Secondary | ICD-10-CM

## 2011-09-12 DIAGNOSIS — Z882 Allergy status to sulfonamides status: Secondary | ICD-10-CM

## 2011-09-12 DIAGNOSIS — K5900 Constipation, unspecified: Secondary | ICD-10-CM | POA: Diagnosis present

## 2011-09-12 DIAGNOSIS — M1990 Unspecified osteoarthritis, unspecified site: Secondary | ICD-10-CM | POA: Diagnosis present

## 2011-09-12 DIAGNOSIS — L89309 Pressure ulcer of unspecified buttock, unspecified stage: Secondary | ICD-10-CM

## 2011-09-12 DIAGNOSIS — S32509A Unspecified fracture of unspecified pubis, initial encounter for closed fracture: Principal | ICD-10-CM

## 2011-09-12 LAB — CBC
HCT: 33.2 % — ABNORMAL LOW (ref 36.0–46.0)
HCT: 32.7 % — ABNORMAL LOW (ref 36.0–46.0)
Hemoglobin: 10.1 g/dL — ABNORMAL LOW (ref 12.0–15.0)
Hemoglobin: 10.2 g/dL — ABNORMAL LOW (ref 12.0–15.0)
MCH: 25.2 pg — ABNORMAL LOW (ref 26.0–34.0)
MCHC: 30.4 g/dL (ref 30.0–36.0)
MCHC: 31.2 g/dL (ref 30.0–36.0)
MCV: 81.2 fL (ref 78.0–100.0)
MCV: 80.9 fL (ref 78.0–100.0)
RBC: 4.04 MIL/uL (ref 3.87–5.11)
RDW: 16.8 % — ABNORMAL HIGH (ref 11.5–15.5)
WBC: 9 10*3/uL (ref 4.0–10.5)

## 2011-09-12 LAB — URINALYSIS, ROUTINE W REFLEX MICROSCOPIC
Bilirubin Urine: NEGATIVE
Hgb urine dipstick: NEGATIVE
Ketones, ur: NEGATIVE mg/dL
Nitrite: NEGATIVE
pH: 7 (ref 5.0–8.0)

## 2011-09-12 LAB — DIFFERENTIAL
Basophils Absolute: 0 10*3/uL (ref 0.0–0.1)
Eosinophils Relative: 3 % (ref 0–5)
Lymphocytes Relative: 18 % (ref 12–46)
Monocytes Absolute: 0.6 10*3/uL (ref 0.1–1.0)
Monocytes Relative: 7 % (ref 3–12)

## 2011-09-12 LAB — POCT I-STAT, CHEM 8
BUN: 12 mg/dL (ref 6–23)
Chloride: 105 mEq/L (ref 96–112)
Creatinine, Ser: 0.7 mg/dL (ref 0.50–1.10)
Glucose, Bld: 103 mg/dL — ABNORMAL HIGH (ref 70–99)
Hemoglobin: 11.6 g/dL — ABNORMAL LOW (ref 12.0–15.0)
Potassium: 4.4 mEq/L (ref 3.5–5.1)
Sodium: 138 mEq/L (ref 135–145)

## 2011-09-12 LAB — CREATININE, SERUM: Creatinine, Ser: 0.55 mg/dL (ref 0.50–1.10)

## 2011-09-12 MED ORDER — AMLODIPINE BESYLATE 2.5 MG PO TABS
2.5000 mg | ORAL_TABLET | Freq: Every day | ORAL | Status: DC
  Administered 2011-09-13 – 2011-09-17 (×5): 2.5 mg via ORAL
  Filled 2011-09-12 (×5): qty 1

## 2011-09-12 MED ORDER — HYDROMORPHONE HCL PF 1 MG/ML IJ SOLN
1.0000 mg | Freq: Once | INTRAMUSCULAR | Status: AC
  Administered 2011-09-12: 1 mg via INTRAVENOUS
  Filled 2011-09-12: qty 1

## 2011-09-12 MED ORDER — ADULT MULTIVITAMIN W/MINERALS CH
1.0000 | ORAL_TABLET | Freq: Every day | ORAL | Status: DC
Start: 2011-09-13 — End: 2011-09-17
  Administered 2011-09-13 – 2011-09-17 (×5): 1 via ORAL
  Filled 2011-09-12 (×5): qty 1

## 2011-09-12 MED ORDER — BISACODYL 5 MG PO TBEC
5.0000 mg | DELAYED_RELEASE_TABLET | Freq: Every day | ORAL | Status: DC | PRN
  Filled 2011-09-12: qty 1

## 2011-09-12 MED ORDER — ONDANSETRON HCL 4 MG/2ML IJ SOLN: 4.0000 mg | Freq: Four times a day (QID) | INTRAMUSCULAR | Status: DC | PRN

## 2011-09-12 MED ORDER — ATENOLOL 100 MG PO TABS
100.0000 mg | ORAL_TABLET | Freq: Every day | ORAL | Status: DC
  Administered 2011-09-13 – 2011-09-17 (×5): 100 mg via ORAL
  Filled 2011-09-12 (×5): qty 1

## 2011-09-12 MED ORDER — ONDANSETRON HCL 4 MG/2ML IJ SOLN
4.0000 mg | Freq: Once | INTRAMUSCULAR | Status: AC
  Administered 2011-09-12: 4 mg via INTRAVENOUS
  Filled 2011-09-12: qty 2

## 2011-09-12 MED ORDER — HYDROMORPHONE HCL PF 1 MG/ML IJ SOLN
1.0000 mg | INTRAMUSCULAR | Status: DC | PRN
  Administered 2011-09-12: 1 mg via INTRAVENOUS
  Filled 2011-09-12: qty 1

## 2011-09-12 MED ORDER — SODIUM CHLORIDE 0.9 % IV SOLN
INTRAVENOUS | Status: DC
  Administered 2011-09-12 – 2011-09-13 (×3): via INTRAVENOUS

## 2011-09-12 MED ORDER — ONDANSETRON HCL 4 MG PO TABS: 4.0000 mg | ORAL_TABLET | Freq: Four times a day (QID) | ORAL | Status: DC | PRN

## 2011-09-12 MED ORDER — POLYETHYLENE GLYCOL 3350 17 G PO PACK
17.0000 g | PACK | Freq: Every day | ORAL | Status: DC | PRN
  Filled 2011-09-12: qty 1

## 2011-09-12 MED ORDER — HEPARIN SODIUM (PORCINE) 5000 UNIT/ML IJ SOLN
5000.0000 [IU] | Freq: Three times a day (TID) | INTRAMUSCULAR | Status: DC
  Administered 2011-09-12 – 2011-09-17 (×14): 5000 [IU] via SUBCUTANEOUS
  Filled 2011-09-12 (×17): qty 1

## 2011-09-12 MED ORDER — HYDROCODONE-ACETAMINOPHEN 5-325 MG PO TABS
1.0000 | ORAL_TABLET | Freq: Four times a day (QID) | ORAL | Status: DC | PRN
  Administered 2011-09-13 – 2011-09-14 (×3): 1 via ORAL
  Administered 2011-09-14 – 2011-09-17 (×6): 2 via ORAL
  Filled 2011-09-12: qty 1
  Filled 2011-09-12: qty 2
  Filled 2011-09-12: qty 1
  Filled 2011-09-12 (×3): qty 2
  Filled 2011-09-12: qty 1
  Filled 2011-09-12 (×2): qty 2

## 2011-09-12 NOTE — ED Notes (Signed)
[id number] Ready 

## 2011-09-12 NOTE — ED Provider Notes (Signed)
History     CSN: [id number]  Arrival date & time 09/12/11  1232   First MD Initiated Contact with Patient 09/12/11 1357      Chief Complaint  Patient presents with  . Pressure Ulcer    (Consider location/radiation/quality/duration/timing/severity/associated sxs/prior treatment) HPI Comments: Patient here with continued right groin pain s/p fall with pubic rami fractures - patient normally lives at home by herself, is very active but her son reports that since the fall, she has to have help getting out of the bed and does not get up unless absolutely necessary - he states that now the patient has multiple small pressure ulcers to bilateral sacral areas because of the stasis.  Patient states that even with the pain medication she cannot take the pain.  She was seen earlier today by her PCP, Dr. Kirby Funk, who sent her here for admission to the hospital for pain control and then for placement for rehab.  Patient is a 78 y.o. female presenting with hip pain. The history is provided by the patient. No language interpreter was used.  Hip Pain This is a new problem. The current episode started in the past 7 days. The problem occurs constantly. The problem has been unchanged. Associated symptoms include arthralgias and weakness. Pertinent negatives include no abdominal pain, anorexia, change in bowel habit, chest pain, chills, congestion, coughing, diaphoresis, fatigue, fever, headaches, joint swelling, myalgias, nausea, neck pain, numbness, rash, sore throat, swollen glands, urinary symptoms, vertigo, visual change or vomiting. The symptoms are aggravated by nothing. She has tried nothing for the symptoms. The treatment provided no relief.    Past Medical History  Diagnosis Date  . Hypertension   . Osteoarthritis   . Colon cancer     Past Surgical History  Procedure Date  . Colon surgery     No family history on file.  History  Substance Use Topics  . Smoking status: Never Smoker     . Smokeless tobacco: Not on file  . Alcohol Use: No    OB History    Grav Para Term Preterm Abortions TAB SAB Ect Mult Living                  Review of Systems  Constitutional: Negative for fever, chills, diaphoresis and fatigue.  HENT: Negative for congestion, sore throat and neck pain.   Respiratory: Negative for cough.   Cardiovascular: Negative for chest pain.  Gastrointestinal: Negative for nausea, vomiting, abdominal pain, anorexia and change in bowel habit.  Musculoskeletal: Positive for arthralgias. Negative for myalgias and joint swelling.  Skin: Negative for rash.  Neurological: Positive for weakness. Negative for vertigo, numbness and headaches.  All other systems reviewed and are negative.    Allergies  Neosporin and Sulfa antibiotics  Home Medications   Current Outpatient Rx  Name Route Sig Dispense Refill  . AMLODIPINE BESYLATE 2.5 MG PO TABS Oral Take 2.5 mg by mouth daily.    . ATENOLOL 100 MG PO TABS Oral Take 100 mg by mouth daily.    Marland Kitchen HYDROCODONE-ACETAMINOPHEN 5-325 MG PO TABS Oral Take 1-2 tablets by mouth every 6 (six) hours as needed for pain. 14 tablet 0  . ADULT MULTIVITAMIN W/MINERALS CH Oral Take 1 tablet by mouth daily.      BP 118/75  Pulse 91  Temp(Src) 99.8 F (37.7 C) (Oral)  Resp 18  SpO2 96%  Physical Exam  Nursing note and vitals reviewed. Constitutional: She is oriented to person, place, and time. She  appears well-developed and well-nourished. No distress.  HENT:  Head: Normocephalic and atraumatic.  Right Ear: External ear normal.  Left Ear: External ear normal.  Nose: Nose normal.  Mouth/Throat: Oropharynx is clear and moist. No oropharyngeal exudate.  Eyes: Conjunctivae are normal. Pupils are equal, round, and reactive to light. No scleral icterus.  Neck: Normal range of motion. Neck supple.  Cardiovascular: Normal rate, regular rhythm and normal heart sounds.  Exam reveals no gallop and no friction rub.   No murmur  heard. Pulmonary/Chest: Effort normal and breath sounds normal. No respiratory distress. She has no wheezes. She has no rales. She exhibits no tenderness.  Abdominal: Soft. Bowel sounds are normal. She exhibits no distension. There is no tenderness.  Musculoskeletal:       Right hip: She exhibits decreased range of motion, tenderness and bony tenderness.       Lumbar back: She exhibits decreased range of motion, tenderness and bony tenderness. She exhibits no deformity.       Back:       Legs: Lymphadenopathy:    She has no cervical adenopathy.  Neurological: She is alert and oriented to person, place, and time. No cranial nerve deficit.  Skin: Skin is warm and dry. Rash noted. There is erythema.     Psychiatric: She has a normal mood and affect. Her behavior is normal. Judgment and thought content normal.    ED Course  Procedures (including critical care time)   Labs Reviewed  CBC  DIFFERENTIAL  URINALYSIS, ROUTINE W REFLEX MICROSCOPIC   No results found. Results for orders placed during the hospital encounter of 09/12/11  CBC      Component Value Range   WBC 9.0  4.0 - 10.5 (K/uL)   RBC 4.09  3.87 - 5.11 (MIL/uL)   Hemoglobin 10.1 (*) 12.0 - 15.0 (g/dL)   HCT 78.2 (*) 95.6 - 46.0 (%)   MCV 81.2  78.0 - 100.0 (fL)   MCH 24.7 (*) 26.0 - 34.0 (pg)   MCHC 30.4  30.0 - 36.0 (g/dL)   RDW 21.3 (*) 08.6 - 15.5 (%)   Platelets 482 (*) 150 - 400 (K/uL)  DIFFERENTIAL      Component Value Range   Neutrophils Relative 72  43 - 77 (%)   Neutro Abs 6.4  1.7 - 7.7 (K/uL)   Lymphocytes Relative 18  12 - 46 (%)   Lymphs Abs 1.6  0.7 - 4.0 (K/uL)   Monocytes Relative 7  3 - 12 (%)   Monocytes Absolute 0.6  0.1 - 1.0 (K/uL)   Eosinophils Relative 3  0 - 5 (%)   Eosinophils Absolute 0.3  0.0 - 0.7 (K/uL)   Basophils Relative 0  0 - 1 (%)   Basophils Absolute 0.0  0.0 - 0.1 (K/uL)  URINALYSIS, ROUTINE W REFLEX MICROSCOPIC      Component Value Range   Color, Urine YELLOW  YELLOW     APPearance HAZY (*) CLEAR    Specific Gravity, Urine 1.025  1.005 - 1.030    pH 7.0  5.0 - 8.0    Glucose, UA NEGATIVE  NEGATIVE (mg/dL)   Hgb urine dipstick NEGATIVE  NEGATIVE    Bilirubin Urine NEGATIVE  NEGATIVE    Ketones, ur NEGATIVE  NEGATIVE (mg/dL)   Protein, ur NEGATIVE  NEGATIVE (mg/dL)   Urobilinogen, UA 0.2  0.0 - 1.0 (mg/dL)   Nitrite NEGATIVE  NEGATIVE    Leukocytes, UA NEGATIVE  NEGATIVE   POCT I-STAT, CHEM  8      Component Value Range   Sodium 138  135 - 145 (mEq/L)   Potassium 4.4  3.5 - 5.1 (mEq/L)   Chloride 105  96 - 112 (mEq/L)   BUN 12  6 - 23 (mg/dL)   Creatinine, Ser 4.09  0.50 - 1.10 (mg/dL)   Glucose, Bld 811 (*) 70 - 99 (mg/dL)   Calcium, Ion 9.14  7.82 - 1.32 (mmol/L)   TCO2 26  0 - 100 (mmol/L)   Hemoglobin 11.6 (*) 12.0 - 15.0 (g/dL)   HCT 95.6 (*) 21.3 - 46.0 (%)   Dg Chest 2 View  09/08/2011  *RADIOLOGY REPORT*  Clinical Data: Shortness of breath  CHEST - 2 VIEW  Comparison: None  Findings: The heart size appears mildly enlarged.  There are small bilateral pleural effusions and pulmonary venous congestion.  No overt edema.  No airspace consolidation is identified.  The patient has a hiatal hernia.  Lung volumes are low and there is asymmetric elevation of the right hemidiaphragm.  IMPRESSION: 1.  Small effusions and pulmonary venous congestion. 2.  Hiatal hernia.  Original Report Authenticated By: Rosealee Albee, M.D.   Ct Hip Right Wo Contrast  09/08/2011  *RADIOLOGY REPORT*  Clinical Data: All March 6.  Sudden onset right groin pain yesterday.  Limited range of motion of the right leg.  CT OF THE RIGHT HIP WITHOUT CONTRAST  Technique:  Multidetector CT imaging was performed according to the standard protocol. Multiplanar CT image reconstructions were also generated.  Comparison: None.  Findings: There is a nondisplaced comminuted fracture of the anterior right inferior pubic ramus.  The fracture plane extends into the parasymphyseal pubis.  There is  mild associated hematoma and edema extending into the adductor compartment.  The root of the right superior pubic ramus appears intact.  Moderate right hip osteoarthritis is present with acetabular subchondral cyst.  The right sacrum is incompletely visualized.  At the superior margin of the scan, there appears to be a right sacral ala fracture that would complement the pubic bone fracture.  Swelling of the right proximal adductor musculature is suspected, compatible with intramuscular hematoma.  Iliofemoral atherosclerosis is noted. Calcified phleboliths are present in the anatomic pelvis.  The femoral neck appears intact.  Common hamstring origins appear normal.  IMPRESSION: 1.  Comminuted nondisplaced right pubic bone fracture extending from parasymphyseal pubis into the anterior right inferior pubic ramus. 2.  Partially visualized probable right sacral ala fracture seen at the margin of the scan.  This would this would complement the right pubic fracture. 3.  Moderate right hip osteoarthritis.  Small right hip effusion.  Original Report Authenticated By: Andreas Newport, M.D.     Inferior pubic rami fracture Intractable pain Decubitus ulcer    MDM  Patient with a history of recent pubic rami fracture who was sent home with oral pain control returns after having been seen by PCP, she is unable to get around at home without the assistance of her son and has now developed decubitus ulcers, she is otherwise quite healthy and active but is unable to get around so she is to be admitted for pain control and then nursing home rehab placement.       Izola Price [place], [place] 09/12/11 1621

## 2011-09-12 NOTE — H&P (Signed)
PCP:  Lillia Mountain, MD, MD   DOA:  09/12/2011 12:37 PM  Chief Complaint:  Right hip pain/pressure ulcer  HPI: 78 years old woman who apparently was very active for her age and participated  on aerobic exercises . She fell in early March and was seen by Dr. Eula Listen, x-rays done showed no fracture at that time the patient was prescribed pain medication. Patient stated that prior to her fall she felt dizzy and weak and fell in the kitchen while she was baking a cake. She denied any chest pain or shortness of breath at that time. Patient however continued to decline with difficulty ambulating and was barely getting out of bed because of right hip pain. She was noted to have pressure ulcers by her son who contacted her PCP Dr. Valentina Lucks who advised him to bring her to the ER for further management. Patient denies any chest pain, shortness of breath, nausea vomiting or diarrhea. She also denies any dysuria, flank pain, cough, fever or chills. In the ED patient was given IV Dilaudid and were asked to admit for further management.  Allergies: Allergies  Allergen Reactions  . Neosporin (Neomycin-Polymyx-Gramicid)   . Sulfa Antibiotics     Prior to Admission medications   Medication Sig Start Date End Date Taking? Authorizing Provider  amLODipine (NORVASC) 2.5 MG tablet Take 2.5 mg by mouth daily.   Yes Historical Provider, MD  atenolol (TENORMIN) 100 MG tablet Take 100 mg by mouth daily.   Yes Historical Provider, MD  HYDROcodone-acetaminophen (NORCO) 5-325 MG per tablet Take 1-2 tablets by mouth every 6 (six) hours as needed for pain. 09/08/11 09/18/11 Yes Shelda Jakes, MD  Multiple Vitamin (MULITIVITAMIN WITH MINERALS) TABS Take 1 tablet by mouth daily.   Yes Historical Provider, MD    Past Medical History  Diagnosis Date  . Hypertension   . Osteoarthritis   . Colon cancer     Past Surgical History  Procedure Date  . Colon surgery     Social History:  She lives alone, her son  checks on her multiple times a day, she reports that she has never smoked. . She reports that she does not drink alcohol or use illicit drugs.   family history: Unremarkable  Review of Systems: As above in history of present illness Constitutional: Denies fever, chills, diaphoresis, appetite change and fatigue.  HEENT: Denies photophobia, eye pain, redness, hearing loss, ear pain, congestion, sore throat, rhinorrhea, sneezing, mouth sores, trouble swallowing, neck pain, neck stiffness and tinnitus.   Respiratory: Denies SOB, DOE, cough, chest tightness,  and wheezing.   Cardiovascular: Denies chest pain, palpitations and leg swelling.  Gastrointestinal: Denies nausea, vomiting, abdominal pain, diarrhea, constipation, blood in stool and abdominal distention.  Genitourinary: Denies dysuria, urgency, frequency, hematuria, flank pain and difficulty urinating.  Neurological: Denies dizziness, seizures, syncope, weakness, light-headedness, numbness and headaches.     Physical Exam:  Filed Vitals:   09/12/11 1254 09/12/11 1510  BP: 118/75 160/72  Pulse: 91 69  Temp: 99.8 F (37.7 C)   TempSrc: Oral   Resp: 18 18  SpO2: 96% 93%    Constitutional: Vital signs reviewed.  Patient is ain no acute distress and cooperative with exam. Alert and oriented x3.  Eyes: PERRL, EOMI, conjunctivae normal, No scleral icterus.  Neck: Supple, Trachea midline normal ROM, No JVD, mass, thyromegaly, or carotid bruit present.  Cardiovascular: RRR, S1 normal, S2 normal, no MRG, pulses symmetric and intact bilaterally Pulmonary/Chest: CTAB, no wheezes, rales, or rhonchi  Abdominal: Soft. Non-tender, non-distended, bowel sounds are normal, no masses, organomegaly, or guarding present.  GU: no CVA tenderness Musculoskeletal: Right hip with decreased ROM  Ext: no edema and no cyanosis, pulses palpable bilaterally  Neurological: A&O x3, Strenght is normal and symmetric bilaterally, cranial nerve II-XII are grossly  intact, no focal motor deficit, sensory intact to light touch bilaterally.  .   Labs on Admission:  Results for orders placed during the hospital encounter of 09/12/11 (from the past 48 hour(s))  CBC     Status: Abnormal   Collection Time   09/12/11  2:08 PM      Component Value Range Comment   WBC 9.0  4.0 - 10.5 (K/uL)    RBC 4.09  3.87 - 5.11 (MIL/uL)    Hemoglobin 10.1 (*) 12.0 - 15.0 (g/dL)    HCT 78.2 (*) 95.6 - 46.0 (%)    MCV 81.2  78.0 - 100.0 (fL)    MCH 24.7 (*) 26.0 - 34.0 (pg)    MCHC 30.4  30.0 - 36.0 (g/dL)    RDW 21.3 (*) 08.6 - 15.5 (%)    Platelets 482 (*) 150 - 400 (K/uL)   DIFFERENTIAL     Status: Normal   Collection Time   09/12/11  2:08 PM      Component Value Range Comment   Neutrophils Relative 72  43 - 77 (%)    Neutro Abs 6.4  1.7 - 7.7 (K/uL)    Lymphocytes Relative 18  12 - 46 (%)    Lymphs Abs 1.6  0.7 - 4.0 (K/uL)    Monocytes Relative 7  3 - 12 (%)    Monocytes Absolute 0.6  0.1 - 1.0 (K/uL)    Eosinophils Relative 3  0 - 5 (%)    Eosinophils Absolute 0.3  0.0 - 0.7 (K/uL)    Basophils Relative 0  0 - 1 (%)    Basophils Absolute 0.0  0.0 - 0.1 (K/uL)   POCT I-STAT, CHEM 8     Status: Abnormal   Collection Time   09/12/11  2:43 PM      Component Value Range Comment   Sodium 138  135 - 145 (mEq/L)    Potassium 4.4  3.5 - 5.1 (mEq/L)    Chloride 105  96 - 112 (mEq/L)    BUN 12  6 - 23 (mg/dL)    Creatinine, Ser 5.78  0.50 - 1.10 (mg/dL)    Glucose, Bld 469 (*) 70 - 99 (mg/dL)    Calcium, Ion 6.29  1.12 - 1.32 (mmol/L)    TCO2 26  0 - 100 (mmol/L)    Hemoglobin 11.6 (*) 12.0 - 15.0 (g/dL)    HCT 52.8 (*) 41.3 - 46.0 (%)   URINALYSIS, ROUTINE W REFLEX MICROSCOPIC     Status: Abnormal   Collection Time   09/12/11  3:08 PM      Component Value Range Comment   Color, Urine YELLOW  YELLOW     APPearance HAZY (*) CLEAR     Specific Gravity, Urine 1.025  1.005 - 1.030     pH 7.0  5.0 - 8.0     Glucose, UA NEGATIVE  NEGATIVE (mg/dL)    Hgb urine  dipstick NEGATIVE  NEGATIVE     Bilirubin Urine NEGATIVE  NEGATIVE     Ketones, ur NEGATIVE  NEGATIVE (mg/dL)    Protein, ur NEGATIVE  NEGATIVE (mg/dL)    Urobilinogen, UA 0.2  0.0 - 1.0 (mg/dL)  Nitrite NEGATIVE  NEGATIVE     Leukocytes, UA NEGATIVE  NEGATIVE  MICROSCOPIC NOT DONE ON URINES WITH NEGATIVE PROTEIN, BLOOD, LEUKOCYTES, NITRITE, OR GLUCOSE <1000 mg/dL.    Radiological Exams on Admission: Dg Chest 2 View  09/08/2011  *RADIOLOGY REPORT*  Clinical Data: Shortness of breath  CHEST - 2 VIEW  Comparison: None  Findings: The heart size appears mildly enlarged.  There are small bilateral pleural effusions and pulmonary venous congestion.  No overt edema.  No airspace consolidation is identified.  The patient has a hiatal hernia.  Lung volumes are low and there is asymmetric elevation of the right hemidiaphragm.  IMPRESSION: 1.  Small effusions and pulmonary venous congestion. 2.  Hiatal hernia.  Original Report Authenticated By: Rosealee Albee, M.D.   Ct Hip Right Wo Contrast  09/08/2011  *RADIOLOGY REPORT*  Clinical Data: All March 6.  Sudden onset right groin pain yesterday.  Limited range of motion of the right leg.  CT OF THE RIGHT HIP WITHOUT CONTRAST  Technique:  Multidetector CT imaging was performed according to the standard protocol. Multiplanar CT image reconstructions were also generated.  Comparison: None.  Findings: There is a nondisplaced comminuted fracture of the anterior right inferior pubic ramus.  The fracture plane extends into the parasymphyseal pubis.  There is mild associated hematoma and edema extending into the adductor compartment.  The root of the right superior pubic ramus appears intact.  Moderate right hip osteoarthritis is present with acetabular subchondral cyst.  The right sacrum is incompletely visualized.  At the superior margin of the scan, there appears to be a right sacral ala fracture that would complement the pubic bone fracture.  Swelling of the right  proximal adductor musculature is suspected, compatible with intramuscular hematoma.  Iliofemoral atherosclerosis is noted. Calcified phleboliths are present in the anatomic pelvis.  The femoral neck appears intact.  Common hamstring origins appear normal.  IMPRESSION: 1.  Comminuted nondisplaced right pubic bone fracture extending from parasymphyseal pubis into the anterior right inferior pubic ramus. 2.  Partially visualized probable right sacral ala fracture seen at the margin of the scan.  This would this would complement the right pubic fracture. 3.  Moderate right hip osteoarthritis.  Small right hip effusion.  Original Report Authenticated By: Andreas Newport, M.D.   Assessment/Plan Principal Problem:  *Pubic bone fracture Active Problems:  Fall, of unclear etiology but doesn't seem to be mechanical from description  HTN (hypertension)  Pressure ulcer of buttock Plan: Will admit patient to telemetry floor. Obtain EKG and check vitals for orthostasis. Gently hydrate. Continue home antihypertensive regimen and adjust as needed. Pain control with Norco and IV Dilaudid when necessary. Add bowel regimen. PT consult. Social worker consult for placement. Wound consult evaluate sacral decubiti. CODE STATUS full code DVT prophylaxis with subcutaneous heparin Voice message left for Dr. Kirby Funk to assume care in the a.m. Time Spent on Admission: Approximately 50 minutes  Onika Gudiel 09/12/2011, 4:59 PM

## 2011-09-12 NOTE — ED Notes (Signed)
Patient was sent to ED by Dr. Danae Chen for [facility] due to right buttocks  Pressure ulcers.  Patient was diagnosed at [facility] for a right pelvic fracture on Saturday and since then has developed pressure ulcers.

## 2011-09-13 MED ORDER — DOCUSATE SODIUM 100 MG PO CAPS
100.0000 mg | ORAL_CAPSULE | Freq: Two times a day (BID) | ORAL | Status: DC
  Administered 2011-09-13 – 2011-09-16 (×5): 100 mg via ORAL
  Filled 2011-09-13 (×10): qty 1

## 2011-09-13 MED ORDER — POLYETHYLENE GLYCOL 3350 17 G PO PACK
17.0000 g | PACK | Freq: Two times a day (BID) | ORAL | Status: DC
  Administered 2011-09-13 (×2): 17 g via ORAL
  Filled 2011-09-13 (×4): qty 1

## 2011-09-13 NOTE — Evaluation (Signed)
Physical Therapy Evaluation Patient Details Name: Rashika Bettes MRN: [id number] DOB: 18-Sep-1920 Today's Date: 09/13/2011  Problem List:  Patient Active Problem List  Diagnoses  . Pubic bone fracture  . Fall  . HTN (hypertension)  . Pressure ulcer of buttock    Past Medical History:  Past Medical History  Diagnosis Date  . Hypertension   . Osteoarthritis   . Colon cancer    Past Surgical History:  Past Surgical History  Procedure Date  . Colon surgery     PT Assessment/Plan/Recommendation PT Assessment Clinical Impression Statement: pt presents s/p fall ~1 month ago and pain increased causing her to present to ER Saturday where Pubic fx was found.  pt notes her plan is to D/C to SNF for rehab prior to returning home with son.   PT Recommendation/Assessment: Patient will need skilled PT in the acute care venue PT Problem List: Decreased strength;Decreased activity tolerance;Decreased balance;Decreased mobility;Decreased knowledge of use of DME;Pain Barriers to Discharge: None PT Therapy Diagnosis : Difficulty walking;Acute pain PT Plan PT Frequency: Min 3X/week PT Treatment/Interventions: DME instruction;Gait training;Stair training;Functional mobility training;Therapeutic activities;Therapeutic exercise;Balance training;Patient/family education PT Recommendation Follow Up Recommendations: Skilled nursing facility Equipment Recommended: Defer to next venue PT Goals  Acute Rehab PT Goals PT Goal Formulation: With patient Time For Goal Achievement: 2 weeks Pt will go Supine/Side to Sit: with modified independence PT Goal: Supine/Side to Sit - Progress: Goal set today Pt will go Sit to Supine/Side: with modified independence PT Goal: Sit to Supine/Side - Progress: Goal set today Pt will go Sit to Stand: with modified independence PT Goal: Sit to Stand - Progress: Goal set today Pt will go Stand to Sit: with modified independence PT Goal: Stand to Sit - Progress: Goal set  today Pt will Ambulate: with supervision;51 - 150 feet;with rolling walker PT Goal: Ambulate - Progress: Goal set today  PT Evaluation Precautions/Restrictions  Precautions Precautions: Fall Restrictions Weight Bearing Restrictions: No Prior Functioning  Home Living Lives With: Sheran Spine Help From: Family Type of Home: House Home Layout: One level Home Adaptive Equipment: Straight cane Additional Comments: pt plans to D/C to SNF for Rehab.   Prior Function Level of Independence: Independent with basic ADLs;Independent with gait;Independent with transfers;Needs assistance with homemaking Able to Take Stairs?: Yes Driving: No Vocation: Retired Financial risk analyst Overall Cognitive Status: Appears within functional limits for tasks assessed Sensation/Coordination   Extremity Assessment RLE Assessment RLE Assessment: Exceptions to [facility] RLE Strength RLE Overall Strength Comments: AROM WFL, but Strength is limited by pain.   LLE Assessment LLE Assessment: Exceptions to [facility] LLE Strength LLE Overall Strength Comments: AROM is WFL, but Strength is limited by pain.   Mobility (including Balance) Bed Mobility Bed Mobility: Yes Supine to Sit: 4: Min assist;HOB elevated (Comment degrees) (HOB ~25 degrees) Supine to Sit Details (indicate cue type and reason): pt limited by pain.   Sitting - Scoot to Edge of Bed: 5: Supervision Sitting - Scoot to [place]rill of Bed Details (indicate cue type and reason): Moves slow and limited by pain.   Transfers Transfers: Yes Sit to Stand: 4: Min assist;With upper extremity assist;From bed;From chair/3-in-1;With armrests Sit to Stand Details (indicate cue type and reason): Cues to use UEs, pt almost MinGuard with stand from 3-in-1.   Stand to Sit: 4: Min assist;With upper extremity assist;With armrests;To chair/3-in-1 Stand to Sit Details: cues to control descent.   Ambulation/Gait Ambulation/Gait: Yes Ambulation/Gait Assistance: 4: Min  assist Ambulation/Gait Assistance Details (indicate cue type and reason): cues  for positioning in RW, use of RW, upright posture.   Ambulation Distance (Feet): 15 Feet Assistive device: Rolling walker Gait Pattern: Step-through pattern;Decreased stride length;Shuffle;Trunk flexed Stairs: No Wheelchair Mobility Wheelchair Mobility: No  Posture/Postural Control Posture/Postural Control: No significant limitations Balance Balance Assessed: No Exercise    End of Session PT - End of Session Equipment Utilized During Treatment: Gait belt Activity Tolerance: Patient tolerated treatment well;Patient limited by pain Patient left: in chair;with call bell in reach;with family/visitor present Nurse Communication: Mobility status for transfers;Mobility status for ambulation General Behavior During Session: [facility] for tasks performed Cognition: [facility] for tasks performed  Sunny Schlein,  [phone] 09/13/2011, 12:33 PM

## 2011-09-13 NOTE — Progress Notes (Addendum)
Clinical Social Work Department CLINICAL SOCIAL WORK PLACEMENT NOTE 09/13/2011  Patient:  Maureen Weiss,Maureen Weiss  Account Number:  [id number] Admit date:  09/12/2011  Clinical Social Worker:  Unk Lightning, LCSW  Date/time:  09/13/2011 02:45 PM  Clinical Social Work is seeking post-discharge placement for this patient at the following level of care:   SKILLED NURSING   (*CSW will update this form in Epic as items are completed)   09/13/2011  Patient/family provided with [facility] Department of Clinical Social Work's list of facilities offering this level of care within the geographic area requested by the patient (or if unable, by the patient's family).  09/13/2011  Patient/family informed of their freedom to choose among providers that offer the needed level of care, that participate in Medicare, Medicaid or managed care program needed by the patient, have an available bed and are willing to accept the patient.  09/13/2011  Patient/family informed of [facility]' ownership interest in [facility], as well as of the fact that they are under no obligation to receive care at this facility.  PASARR submitted to EDS on 09/13/2011 PASARR number received from EDS on   FL2 transmitted to all facilities in geographic area requested by pt/family on  09/13/2011 FL2 transmitted to all facilities within larger geographic area on   Patient informed that his/her managed care company has contracts with or will negotiate with  certain facilities, including the following:     Patient/family informed of bed offers received:  09/14/11 Patient/family chooses bed at [facility] Physician recommends and patient chooses bed at    Patient to be transferred to [place] on 09/17/11   Patient to be transferred to facility by ambulance  The following physician request were entered in Epic:   Additional Comments:  09/14/11 - CSW talked with patient and son Irys Nigh ([phone]) regarding bed offers and  advised that [facility] bed offer (at approx 3:567 pm). CSW will follow-up with admissions director on Monday morning to confirm bed availability. Mr. Blucher indicated that [facility] is the 2nd choice if [facility] does not work out. CSW will contact [facility] Monday morning and follow-up with patient and son. 09/17/11 - Son completed admissions paperwork at [facility].

## 2011-09-13 NOTE — Evaluation (Signed)
Occupational Therapy Evaluation Patient Details Name: Maureen Weiss MRN: [id number] DOB: June 06, 1921 Today's Date: 09/13/2011  Problem List:  Patient Active Problem List  Diagnoses  . Pubic bone fracture  . Fall  . HTN (hypertension)  . Pressure ulcer of buttock    Past Medical History:  Past Medical History  Diagnosis Date  . Hypertension   . Osteoarthritis   . Colon cancer    Past Surgical History:  Past Surgical History  Procedure Date  . Colon surgery     OT Assessment/Plan/Recommendation OT Assessment Clinical Impression Statement: This 78 yo female admitted for right hip pain and pressure and found to have a fracture of pubic bone presents to acute OT with problems below thus affecting PLOF at I with BADLs/IADLs. Will benefit from acute OT with follow-up OT at SNF. OT Recommendation/Assessment: Patient will need skilled OT in the acute care venue OT Problem List: Decreased strength;Decreased activity tolerance;Pain;Impaired balance (sitting and/or standing);Decreased knowledge of use of DME or AE Barriers to Discharge: Decreased caregiver support OT Therapy Diagnosis : Generalized weakness;Acute pain OT Plan OT Frequency: Min 2X/week OT Treatment/Interventions: Self-care/ADL training;DME and/or AE instruction;Patient/family education;Balance training OT Recommendation Follow Up Recommendations: Skilled nursing facility Equipment Recommended: Defer to next venue Individuals Consulted Consulted and Agree with Results and Recommendations: Patient;Family member/caregiver Family Member Consulted: son OT Goals Acute Rehab OT Goals OT Goal Formulation: With patient Time For Goal Achievement: 7 days ADL Goals Pt Will Perform Grooming: with set-up;with supervision;Unsupported;Standing at sink (at least 2 tasks) ADL Goal: Grooming - Progress: Goal set today Pt Will Perform Lower Body Dressing: with set-up;with supervision;Unsupported;Sit to stand from bed;with adaptive  equipment ADL Goal: Lower Body Dressing - Progress: Goal set today Pt Will Transfer to Toilet: with supervision;Ambulation;Regular height toilet;Grab bars;with DME;3-in-1 ADL Goal: Toilet Transfer - Progress: Goal set today  OT Evaluation Precautions/Restrictions  Precautions Precautions: Fall Required Braces or Orthoses: No Restrictions Weight Bearing Restrictions: No Prior Functioning Home Living Lives With: Alone Receives Help From: Family (intermittently) Type of Home: House Home Layout: One level Bathroom Shower/Tub: Tub/shower unit;Curtain Firefighter: Standard Bathroom Accessibility: Yes How Accessible: Accessible via walker Home Adaptive Equipment: Straight cane;Shower chair with back Prior Function Level of Independence: Independent with basic ADLs;Independent with homemaking with ambulation;Independent with gait;Independent with transfers Driving: Yes Vocation: Retired ADL ADL Eating/Feeding: Simulated;Independent Where Assessed - Eating/Feeding: Bed level Grooming: Simulated;Set up Where Assessed - Grooming: Sitting, bed;Unsupported Upper Body Bathing: Simulated;Set up Where Assessed - Upper Body Bathing: Unsupported;Sitting, bed Lower Body Bathing: Simulated;Moderate assistance Where Assessed - Lower Body Bathing: Supported;Sit to stand from bed Upper Body Dressing: Simulated;Set up Where Assessed - Upper Body Dressing: Unsupported;Sitting, chair Lower Body Dressing: Simulated;Moderate assistance Where Assessed - Lower Body Dressing: Supported;Sit to stand from bed Toilet Transfer: Simulated;Minimal assistance Toilet Transfer Details (indicate cue type and reason): Bed to door back to bed with RW Toilet Transfer Method: Ambulating Toileting - Clothing Manipulation: Minimal assistance;Simulated Where Assessed - Toileting Clothing Manipulation: Standing Toileting - Hygiene: Simulated;Independent (sit on bed) Tub/Shower Transfer: Not assessed Tub/Shower  Transfer Method: Not assessed Equipment Used: Rolling walker (gait belt) Ambulation Related to ADLs: Min A with RW Vision/Perception  Vision - History Baseline Vision: No visual deficits Cognition Cognition Arousal/Alertness: Awake/alert Overall Cognitive Status: Appears within functional limits for tasks assessed Sensation/Coordination   Extremity Assessment RUE Assessment RUE Assessment: Within Functional Limits LUE Assessment LUE Assessment: Within Functional Limits (does c/o under arm soreness) Mobility  Bed Mobility Bed Mobility: Yes Supine to Sit: 5: Supervision;With  rails;HOB elevated (Comment degrees) Sitting - Scoot to Edge of Bed: 6: Modified independent (Device/Increase time);With rail Transfers Transfers: Yes Sit to Stand: 4: Min assist;With upper extremity assist;From bed Stand to Sit: 4: Min assist;To chair/3-in-1;With armrests;With upper extremity assist Stand to Sit Details: VC's to reach back for arm rests Exercises   End of Session OT - End of Session Equipment Utilized During Treatment: Gait belt (RW) Activity Tolerance: Patient tolerated treatment well Patient left: in bed;with call bell in reach;with family/visitor present (son) General Behavior During Session: [facility] for tasks performed Cognition: [facility] for tasks performed   Evette Georges [phone] 09/13/2011, 4:23 PM

## 2011-09-13 NOTE — Consult Note (Signed)
WOC consult Note Reason for Consult: Consult requested for buttocks wound.  Dry scab to upper buttock removed easily with pink dry skin underneath.  Lower buttocks with .2X.2cm red lesion.  Appearance and location not consistent with pressure ulcer. Pressure Ulcer POA: No Drainage (amount, consistency, odor) No open wound or drainage. Dressing procedure/placement/frequency: Barrier cream to protect skin from friction/incontinence. Will not plan to follow further unless re-consulted.  [address] RN, MSN, [employer]  [phone]

## 2011-09-13 NOTE — Clinical Documentation Improvement (Signed)
BMI DOCUMENTATION CLARIFICATION QUERY  THIS DOCUMENT IS NOT A PERMANENT PART OF THE MEDICAL RECORD  TO RESPOND TO THE THIS QUERY, FOLLOW THE INSTRUCTIONS BELOW:  1. If needed, update documentation for the patient's encounter via the notes activity.  2. Access this query again and click edit on the In [employer].  3. After updating, or not, click F2 to complete all highlighted (required) fields concerning your review. Select "additional documentation in the medical record" OR "no additional documentation provided".  4. Click Sign note button.  5. The deficiency will fall out of your In Basket *Please let us know if you are not able to complete this workflow by phone or e-mail (listed below).         09/13/11  Dear Dr. Cleotis Lema Marton Redwood  In an effort to better capture your patient's severity of illness, reflect appropriate length of stay and utilization of resources, a review of the patient medical record has revealed the following indicators.    Possible Clinical conditions - Underweight=  BMI =/< 19.0  - Other Condition (please specify) -x Cannot Clinically Determine  Supporting Information - Risk Factors: Fracture Pubic rami, fall - BMI = 18.8 per 4/3 Doc flowsheet Ht/Wt  Reviewed:  no additional documentation provided  Thank Bonita Quin  Orson Ape Clinical Documentation Specialist: CELL: [phone] Health Information Management [place]

## 2011-09-13 NOTE — Progress Notes (Signed)
Utilization Review Completed.Charnae Lill T4/09/2011   

## 2011-09-13 NOTE — Progress Notes (Signed)
Subjective: Still in pain  Objective: Vital signs in last 24 hours: Temp:  [97.9 F (36.6 C)-99.8 F (37.7 C)] 98.3 F (36.8 C) (04/04 0500) Pulse Rate:  [61-91] 67  (04/04 0500) Resp:  [12-23] 18  (04/04 0500) BP: (118-172)/(55-87) 153/70 mmHg (04/04 0500) SpO2:  [91 %-100 %] 91 % (04/04 0500) Weight:  [43.545 kg (96 lb)] 43.545 kg (96 lb) (04/03 2010) Weight change:  Last BM Date: 09/11/11  Intake/Output from previous day:   Intake/Output this shift:    General appearance: alert and cooperative Resp: clear to auscultation bilaterally Cardio: regular rate and rhythm, S1, S2 normal, no murmur, click, rub or gallop GI: soft, non-tender; bowel sounds normal; no masses,  no organomegaly Incision/Wound: small crusted areas on buttocks  Lab Results:  Basename 09/12/11 2020 09/12/11 1443 09/12/11 1408  WBC 7.7 -- 9.0  HGB 10.2* 11.6* --  HCT 32.7* 34.0* --  PLT 433* -- 482*   BMET  Basename 09/12/11 2020 09/12/11 1443  NA -- 138  K -- 4.4  CL -- 105  CO2 -- --  GLUCOSE -- 103*  BUN -- 12  CREATININE 0.55 0.70  CALCIUM -- --    Studies/Results: No results found.  Medications: I have reviewed the patient's current medications.  Assessment/Plan: Principal Problem:  *Pubic bone fracture  Pain control, PT/OT. Active Problems:  Fall almost 1 month ago, telemetry for 24 hrs to r/o bradycardia on b-blocker  HTN (hypertension) controlled  Pressure ulcer of buttock  Minimal change, wound care  Constipation  No BM for 5 days on pain meds, miralax and colace  Disposition   [facility] for rehab   LOS: 1 day   Maureen Weiss JOSEPH 09/13/2011, 7:28 AM

## 2011-09-13 NOTE — Progress Notes (Signed)
Clinical Social Work Department BRIEF PSYCHOSOCIAL ASSESSMENT 09/13/2011  Patient:  Maureen Weiss,Maureen Weiss     Account Number:  [id number]     Admit date:  09/12/2011  Clinical Social Worker:  Dennison Bulla  Date/Time:  09/13/2011 02:45 PM  Referred by:  Physician  Date Referred:  09/13/2011 Referred for  SNF Placement   Other Referral:   Interview type:  Patient Other interview type:   Son-Maureen Weiss present as well    PSYCHOSOCIAL DATA Living Status:  ALONE Admitted from facility:   Level of care:   Primary support name:  Maureen Weiss Primary support relationship to patient:  CHILD, ADULT Degree of support available:   Strong    CURRENT CONCERNS Current Concerns  Post-Acute Placement   Other Concerns:    SOCIAL WORK ASSESSMENT / PLAN CSW received referral for SNF placement. Per patient and son, patient fracture pelvis and was ambulating well but then started to experience more pain and unable to get out of bed. Patient was admitted for SNF placement. Patient has never been to SNF. Patient prefers [facility]. CSW explained SNF process and CSW role. CSW completed FL2 and faxed information to [facility]. CSW will continue to follow.   Assessment/plan status:  Psychosocial Support/Ongoing Assessment of Needs Other assessment/ plan:   Information/referral to community resources:   SNF List    PATIENT'S/FAMILY'S RESPONSE TO PLAN OF CARE: Patient was alert and oriented and asked for son to be involved. Patient prefers [facility] because she has had friends to go there. Patient aware of process. Patient and son has CSW contact information.

## 2011-09-14 LAB — CBC
Platelets: 439 10*3/uL — ABNORMAL HIGH (ref 150–400)
RDW: 16.7 % — ABNORMAL HIGH (ref 11.5–15.5)
WBC: 5.9 10*3/uL (ref 4.0–10.5)

## 2011-09-14 LAB — BASIC METABOLIC PANEL
Calcium: 8.4 mg/dL (ref 8.4–10.5)
Chloride: 104 mEq/L (ref 96–112)
Creatinine, Ser: 0.61 mg/dL (ref 0.50–1.10)
GFR calc Af Amer: >90 mL/min (ref >90)

## 2011-09-14 LAB — DIFFERENTIAL
Basophils Absolute: 0 10*3/uL (ref 0.0–0.1)
Lymphocytes Relative: 26 % (ref 12–46)
Neutro Abs: 3.4 10*3/uL (ref 1.7–7.7)
Neutrophils Relative %: 57 % (ref 43–77)

## 2011-09-14 LAB — IRON AND TIBC
Iron: 15 ug/dL — ABNORMAL LOW (ref 42–135)
Saturation Ratios: 7 % — ABNORMAL LOW (ref 20–55)
UIBC: 208 ug/dL (ref 125–400)

## 2011-09-14 MED ORDER — POLYETHYLENE GLYCOL 3350 17 G PO PACK
17.0000 g | PACK | Freq: Every day | ORAL | Status: DC
  Administered 2011-09-14: 17 g via ORAL
  Filled 2011-09-14 (×3): qty 1

## 2011-09-14 NOTE — Progress Notes (Signed)
Physical Therapy Treatment Patient Details Name: Maureen Weiss MRN: [id number] DOB: December 30, 1920 Today's Date: 09/14/2011  PT Assessment/Plan  PT - Assessment/Plan Comments on Treatment Session: Pt progressing well in therapy, despite pain with mobility. Pt expressed desire to go to [facility] at DC if able. Pt continues to need cues for safey and gait pattern, but is mostly Min-guard assist for steadying with mobility. PT Plan: Discharge plan remains appropriate PT Frequency: Min 3X/week Follow Up Recommendations: Skilled nursing facility Equipment Recommended: Defer to next venue PT Goals  Acute Rehab PT Goals PT Goal: Sit to Stand - Progress: Progressing toward goal PT Goal: Stand to Sit - Progress: Progressing toward goal PT Goal: Ambulate - Progress: Progressing toward goal  PT Treatment Precautions/Restrictions  Precautions Precautions: Fall Required Braces or Orthoses: No Restrictions Weight Bearing Restrictions: No Mobility (including Balance) Bed Mobility Bed Mobility: No Transfers Transfers: Yes Sit to Stand: 4: Min assist;With upper extremity assist;With armrests;From chair/3-in-1 Sit to Stand Details (indicate cue type and reason): Cues to scoot forward in chair, steadying assist only.  Stand to Sit: 4: Min assist;To chair/3-in-1;With armrests;With upper extremity assist Stand to Sit Details: steadying assist only with cues for safe hand placement to control descent Ambulation/Gait Ambulation/Gait: Yes Ambulation/Gait Assistance: 4: Min assist Ambulation/Gait Assistance Details (indicate cue type and reason): Steadying assist only. Repeated cues for gait pattern and RW proximity to self. Pt begining to use continuous gait pattern occasionally, but returns to step-to due to pain.  Ambulation Distance (Feet): 40 Feet Assistive device: Rolling walker Gait Pattern: Step-to pattern;Decreased stride length;Antalgic Stairs: No Wheelchair Mobility Wheelchair Mobility: No   Balance Balance Assessed: Yes Static Standing Balance Static Standing - Balance Support: Bilateral upper extremity supported Static Standing - Level of Assistance: 5: Stand by assistance Static Standing - Comment/# of Minutes: Static standing in RW prior to gait x45 sec  Exercise  General Exercises - Lower Extremity Ankle Circles/Pumps: Seated;20 reps;Both;Strengthening;AROM Long Arc Quad: Seated;10 reps;Both;Strengthening;AROM (x2 reps, cues for full ROM as able, but increased pain. ) Hip Flexion/Marching: Seated;10 reps;Both;Right;AROM (Decreased ROM on R, but able to perform) End of Session PT - End of Session Equipment Utilized During Treatment: Gait belt Activity Tolerance: Patient tolerated treatment well;Patient limited by pain Patient left: in chair;with call bell in reach (Chair alarm on) General Behavior During Session: [facility] for tasks performed Cognition: [facility] for tasks performed  Virl Cagey, [place] [phone]  09/14/2011, 12:17 PM

## 2011-09-14 NOTE — Progress Notes (Signed)
Occupational Therapy Treatment Patient Details Name: Neville Pauls MRN: [id number] DOB: Sep 23, 1920 Today's Date: 09/14/2011  OT Assessment/Plan OT Assessment/Plan Comments on Treatment Session: Pt progressing this session to sink level task. Pt don lip stick at the sink and smiling. Pt very concerned with d/c location available. Pt was declined per pt from [facility] which was her first choice OT Plan: Discharge plan remains appropriate Equipment Recommended: Defer to next venue OT Goals ADL Goals Pt Will Perform Grooming: with set-up;with supervision;Unsupported;Standing at sink ADL Goal: Grooming - Progress: Progressing toward goals Pt Will Perform Lower Body Dressing: with set-up;with supervision;Unsupported;Sit to stand from bed;with adaptive equipment ADL Goal: Lower Body Dressing - Progress: Progressing toward goals Pt Will Transfer to Toilet: with supervision;Ambulation;Regular height toilet;Grab bars;with DME;3-in-1 ADL Goal: Toilet Transfer - Progress: Progressing toward goals  OT Treatment Precautions/Restrictions  Precautions Precautions: Fall Required Braces or Orthoses: No Restrictions Weight Bearing Restrictions: No   ADL ADL Grooming: Performed;Set up Grooming Details (indicate cue type and reason): guarding for balance as sink level Where Assessed - Grooming: Standing at sink Lower Body Dressing: Performed;Minimal assistance Lower Body Dressing Details (indicate cue type and reason): doff adult diaper and don new diaper Where Assessed - Lower Body Dressing: Sit to stand from chair;Supported Statistician: Performed;Moderate assistance Toilet Transfer Method: Stand pivot Toilet Transfer Equipment: Raised toilet seat with arms (or 3-in-1 over toilet) Toileting - Clothing Manipulation: Performed;Other (comment) (min guard) Where Assessed - Toileting Clothing Manipulation: Standing Toileting - Hygiene: Performed;Other (comment) (min guard) Where Assessed -  Toileting Hygiene: Sit to stand from 3-in-1 or toilet Mobility  Bed Mobility Bed Mobility: Yes Supine to Sit: 5: Supervision;With rails;HOB elevated (Comment degrees) Supine to Sit Details (indicate cue type and reason): a few cries out loud due to pain and clinching teeth Sitting - Scoot to Edge of Bed: 6: Modified independent (Device/Increase time);With rail Transfers Transfers: Yes Sit to Stand: 4: Min assist;With upper extremity assist;With armrests;From chair/3-in-1 Sit to Stand Details (indicate cue type and reason): Cues to scoot forward in chair, steadying assist only.  Stand to Sit: 4: Min assist;To chair/3-in-1;With armrests;With upper extremity assist Stand to Sit Details: steadying assist only with cues for safe hand placement to control descent Exercises General Exercises - Lower Extremity Ankle Circles/Pumps: Seated;20 reps;Both;Strengthening;AROM Long Arc Quad: Seated;10 reps;Both;Strengthening;AROM (x2 reps, cues for full ROM as able, but increased pain. ) Hip Flexion/Marching: Seated;10 reps;Both;Right;AROM (Decreased ROM on R, but able to perform)  End of Session OT - End of Session Equipment Utilized During Treatment: Gait belt Activity Tolerance: Patient tolerated treatment well Patient left: in bed;with call bell in reach;with family/visitor present (son) Nurse Communication: Mobility status for transfers;Mobility status for ambulation General Behavior During Session: [facility] for tasks performed Cognition: [facility] for tasks performed  Lucile Shutters  09/14/2011, 3:29 PM Pager: [phone]

## 2011-09-14 NOTE — Progress Notes (Signed)
   CARE MANAGEMENT NOTE 09/14/2011  Patient:  Maureen Weiss,Maureen Weiss   Account Number:  [id number]  Date Initiated:  09/14/2011  Documentation initiated by:  GRAVES-BIGELOW,Shayleen Eppinger  Subjective/Objective Assessment:   Pt admitted with Pubic bone fracture. Plan is for SNF at d/c. CSW working with pt for placement.     Action/Plan:   Anticipated DC Date:  09/17/2011   Anticipated DC Plan:  SKILLED NURSING FACILITY  In-house referral  Clinical Social Worker      DC Planning Services  CM consult      Choice offered to / List presented to:             Status of service:  Completed, signed off Medicare Important Message given?   (If response is "NO", the following Medicare IM given date fields will be blank) Date Medicare IM given:   Date Additional Medicare IM given:    Discharge Disposition:  SKILLED NURSING FACILITY  Per UR Regulation:    If discussed at Long Length of Stay Meetings, dates discussed:    Comments:  09-14-11 1126 Tomi Bamberger, RN,BSN [phone]  No other needs from CM at this time.

## 2011-09-14 NOTE — Progress Notes (Signed)
Subjective: Pain somewhat better, ambulated 15 feet, had BM  Objective: Vital signs in last 24 hours: Temp:  [98.1 F (36.7 C)-98.4 F (36.9 C)] 98.1 F (36.7 C) (04/05 0600) Pulse Rate:  [65-67] 66  (04/05 0600) Resp:  [16-20] 20  (04/05 0600) BP: (106-154)/(61-72) 154/61 mmHg (04/05 0600) SpO2:  [93 %-94 %] 94 % (04/05 0600) Weight:  [45.4 kg (100 lb 1.4 oz)] 45.4 kg (100 lb 1.4 oz) (04/05 0600) Weight change: 1.855 kg (4 lb 1.4 oz) Last BM Date: 09/11/11  Intake/Output from previous day: 04/04 0701 - 04/05 0700 In: 800 [P.O.:800] Out: 350 [Urine:350] Intake/Output this shift:    General appearance: alert and cooperative Resp: clear to auscultation bilaterally Cardio: regular rate and rhythm, S1, S2 normal, no murmur, click, rub or gallop GI: soft, non-tender; bowel sounds normal; no masses,  no organomegaly Extremities: extremities normal, atraumatic, no cyanosis or edema  Lab Results:  Basename 09/14/11 0430 09/12/11 2020  WBC 5.9 7.7  HGB 9.2* 10.2*  HCT 29.6* 32.7*  PLT 439* 433*   BMET  Basename 09/14/11 0430 09/12/11 2020 09/12/11 1443  NA 139 -- 138  K 3.7 -- 4.4  CL 104 -- 105  CO2 28 -- --  GLUCOSE 98 -- 103*  BUN 10 -- 12  CREATININE 0.61 0.55 --  CALCIUM 8.4 -- --    Studies/Results: No results found.  Medications: I have reviewed the patient's current medications.  Assessment/Plan: Principal Problem:  *Pubic bone fracture pain control, PT/OT.  D/C IVFs Active Problems:  Fall PT  HTN (hypertension)  BP ok  Constipation had BM on current regimen  Anemia  Check iron studies and hemoccults  Disposition  She is medically ready for NHP    LOS: 2 days   Maureen Weiss JOSEPH 09/14/2011, 7:25 AM

## 2011-09-15 MED ORDER — FERROUS SULFATE 325 (65 FE) MG PO TABS
325.0000 mg | ORAL_TABLET | Freq: Every day | ORAL | Status: DC
  Administered 2011-09-16 – 2011-09-17 (×2): 325 mg via ORAL
  Filled 2011-09-15 (×3): qty 1

## 2011-09-15 NOTE — ED Provider Notes (Signed)
Medical screening examination/treatment/procedure(s) were conducted as a shared visit with non-physician practitioner(s) and myself.  I personally evaluated the patient during the encounter Elderly female with prior pelvic fx and unable to ambulate at home due to pain and now has pressure ulcer. No infectious sx but pain not controlled with po's at home.  Gwyneth Sprout, MD 09/15/11 2049

## 2011-09-15 NOTE — Progress Notes (Signed)
Subjective: No new complaints  Objective: Vital signs in last 24 hours: Temp:  [97.6 F (36.4 C)-98.4 F (36.9 C)] 97.6 F (36.4 C) (04/06 0408) Pulse Rate:  [60-68] 65  (04/06 0408) Resp:  [18-24] 20  (04/06 0408) BP: (135-169)/(65-78) 158/75 mmHg (04/06 0408) SpO2:  [93 %-97 %] 94 % (04/06 0408) Weight change:  Last BM Date: 09/14/11  Intake/Output from previous day: 04/05 0701 - 04/06 0700 In: 555 [P.O.:555] Out: 475 [Urine:475] Intake/Output this shift:    Resp: clear to auscultation bilaterally Cardio: regular rate and rhythm, S1, S2 normal, no murmur, click, rub or gallop  Lab Results:  Basename 09/14/11 0430 09/12/11 2020  WBC 5.9 7.7  HGB 9.2* 10.2*  HCT 29.6* 32.7*  PLT 439* 433*   BMET  Basename 09/14/11 0430 09/12/11 2020 09/12/11 1443  NA 139 -- 138  K 3.7 -- 4.4  CL 104 -- 105  CO2 28 -- --  GLUCOSE 98 -- 103*  BUN 10 -- 12  CREATININE 0.61 0.55 --  CALCIUM 8.4 -- --    Studies/Results: No results found.  Medications: I have reviewed the patient's current medications.  Assessment/Plan: Principal Problem:  *Pubic bone fracture  Pain control,PT Active Problems:  Fall  HTN (hypertension) ok  Anemia, iron deficient.  Start iron, check hemoccults.  Constipation  Continue colace and laxative  Ready for NHP   LOS: 3 days   Hue Frick Maureen Weiss 09/15/2011, 9:17 AM

## 2011-09-16 MED ORDER — POLYETHYLENE GLYCOL 3350 17 G PO PACK
17.0000 g | PACK | Freq: Every day | ORAL | Status: DC | PRN
  Filled 2011-09-16: qty 1

## 2011-09-16 NOTE — Progress Notes (Signed)
Subjective: Pain improving.  Had 2 bowel movements yesterday  Objective: Vital signs in last 24 hours: Temp:  [98.1 F (36.7 C)-98.3 F (36.8 C)] 98.3 F (36.8 C) (04/07 0641) Pulse Rate:  [59-81] 59  (04/07 0641) Resp:  [18-19] 19  (04/07 0641) BP: (108-135)/(57-82) 121/57 mmHg (04/07 0949) SpO2:  [93 %-97 %] 93 % (04/07 0641) Weight change:  Last BM Date: 09/15/11  Intake/Output from previous day: 04/06 0701 - 04/07 0700 In: 480 [P.O.:480] Out: 150 [Urine:150] Intake/Output this shift: Total I/O In: 240 [P.O.:240] Out: -   General appearance: alert and cooperative  Lab Results:  Basename 09/14/11 0430  WBC 5.9  HGB 9.2*  HCT 29.6*  PLT 439*   BMET  Basename 09/14/11 0430  NA 139  K 3.7  CL 104  CO2 28  GLUCOSE 98  BUN 10  CREATININE 0.61  CALCIUM 8.4    Studies/Results: No results found.  Medications: I have reviewed the patient's current medications.  Assessment/Plan: Principal Problem:  *Pubic bone fracture  Pain control and PT Active Problems:  Fall  HTN (hypertension) OK  Anemia  Recheck in am.  Check hemoccults, on iron  Constipation improved.  D/C miralax and continue colace  Disposition ready for NHP in am.   LOS: 4 days   Maureen Weiss JOSEPH 09/16/2011, 10:08 AM

## 2011-09-17 LAB — DIFFERENTIAL
Basophils Relative: 0 % (ref 0–1)
Eosinophils Absolute: 0.5 10*3/uL (ref 0.0–0.7)
Eosinophils Relative: 6 % — ABNORMAL HIGH (ref 0–5)
Monocytes Relative: 9 % (ref 3–12)
Neutrophils Relative %: 61 % (ref 43–77)

## 2011-09-17 LAB — CBC
MCH: 25.5 pg — ABNORMAL LOW (ref 26.0–34.0)
MCHC: 30.8 g/dL (ref 30.0–36.0)
Platelets: 549 10*3/uL — ABNORMAL HIGH (ref 150–400)

## 2011-09-17 MED ORDER — FERROUS SULFATE 325 (65 FE) MG PO TABS: 325.0000 mg | ORAL_TABLET | Freq: Every day | ORAL | Status: DC

## 2011-09-17 MED ORDER — DSS 100 MG PO CAPS: 100.0000 mg | ORAL_CAPSULE | Freq: Two times a day (BID) | ORAL | Status: AC

## 2011-09-17 MED ORDER — POLYETHYLENE GLYCOL 3350 17 G PO PACK: 17.0000 g | PACK | Freq: Every day | ORAL | Status: AC | PRN

## 2011-09-17 NOTE — Progress Notes (Signed)
Physical Therapy Treatment Patient Details Name: Maureen Weiss MRN: [id number] DOB: [birth year] Today's Date: 09/17/2011  PT Assessment/Plan  PT - Assessment/Plan Comments on Treatment Session: Pt continues to progress with increased mobility and decreased pain.  Pt for d/c today to SNF per pt.  Continues to need cues and contact assist for safety. PT Plan: Discharge plan remains appropriate PT Frequency: Min 3X/week Follow Up Recommendations: Skilled nursing facility Equipment Recommended: Defer to next venue PT Goals  Acute Rehab PT Goals PT Goal: Supine/Side to Sit - Progress: Progressing toward goal PT Goal: Sit to Supine/Side - Progress: Progressing toward goal PT Goal: Sit to Stand - Progress: Progressing toward goal PT Goal: Stand to Sit - Progress: Progressing toward goal PT Goal: Ambulate - Progress: Progressing toward goal  PT Treatment Precautions/Restrictions  Precautions Precautions: Fall Required Braces or Orthoses: No Restrictions Weight Bearing Restrictions: No Mobility (including Balance) Bed Mobility Bed Mobility: Yes Supine to Sit: 5: Supervision;HOB elevated (Comment degrees) (HOB 30 degrees) Supine to Sit Details (indicate cue type and reason): No c/o pain with bed mobility Sitting - Scoot to Edge of Bed: 6: Modified independent (Device/Increase time) Transfers Transfers: Yes Sit to Stand: 4: Min assist;With upper extremity assist;From bed;From chair/3-in-1;With armrests Sports coach assist for safety) Sit to Stand Details (indicate cue type and reason): Cues for hand placement Stand to Sit: 4: Min assist;With upper extremity assist;To chair/3-in-1 (Contact Guard assist) Stand to Sit Details: Cues for hand placement Ambulation/Gait Ambulation/Gait: Yes Ambulation/Gait Assistance: 4: Min assist Ambulation/Gait Assistance Details (indicate cue type and reason): Cues for positioning RW Ambulation Distance (Feet): 70 Feet Assistive device: Rolling  walker Gait Pattern: Step-to pattern;Decreased stride length;Trunk flexed;Shuffle Stairs: No Corporate treasurer: No  Posture/Postural Control Posture/Postural Control: No significant limitations Balance Balance Assessed: No Static Standing Balance Static Standing - Balance Support: Bilateral upper extremity supported Static Standing - Level of Assistance: 5: Stand by assistance Static Standing - Comment/# of Minutes: 45 seconds Exercise  General Exercises - Lower Extremity Ankle Circles/Pumps: 10 reps;AROM;Both;Seated Long Arc Quad: Seated;5 reps;Both;AROM End of Session PT - End of Session Activity Tolerance: Patient tolerated treatment well Patient left: in chair;with call bell in reach;Other (comment) (Chair alarm in place and set) Nurse Communication: Mobility status for transfers;Mobility status for ambulation General Behavior During Session: [facility] for tasks performed Cognition: [facility] for tasks performed  Newell Coral 09/17/2011, 9:38 AM  Newell Coral, PTA Acute Rehab [phone] (office)

## 2011-09-17 NOTE — Discharge Summary (Signed)
Physician Discharge Summary  Patient ID: Maureen Weiss MRN: [id number] DOB/AGE: [birth year] 78 y.o.  Admit date: 09/12/2011 Discharge date: 09/17/2011  Admission Diagnoses: Pubic bone fracture Hypertension   Discharge Diagnoses:  Principal Problem:  *Pubic bone fracture Active Problems:  Fall  HTN (hypertension)  Constipation Iron deficiency anemia   Discharged Condition: good  Hospital Course: The patient had fallen in early March and had a plain x-ray of the pelvis which showed no fracture. She continued to have pain with ambulation and some right hip pain and thigh pain. She had come to the ER on March 30 and had a CT scan of the hip showing a comminuted nondisplaced right pubic bone fracture extending from the paraSymphyseal pubis into the anterior right inferior pubic ramus. There is also probable right sacral, fracture.  The patient was sent home but over the ensuing several days she was unable to ambulate has been most of her time lying in bed. Because of severe pain and inability to ambulate she was admitted to the hospital for pain control and also was found to be mildly dehydrated and given IV fluids. She was given some IV and then oral pain medications in the hospital. She was seen by physical and occupational therapy and they felt that nursing home placement for rehabilitation was appropriate and this was arranged. Her blood pressure remained stable. She did develop some constipation on narcotics and was treated with  some stool softeners and laxatives with good results. She also was found to have a moderate anemia with low iron level and start on iron, no evidence of GI bleeding during the hospitalization.  Consults: None  Significant Diagnostic Studies: CBC at discharge hemoglobin 9.8 potassium 5.9 WBC 7.6.   Treatments: IV hydration and analgesia: Vicodin  Discharge Exam: Blood pressure 125/69, pulse 68, temperature 98.1 F (36.7 C), temperature source Oral, resp. rate 20,  height 5' (1.524 m), weight 45.4 kg (100 lb 1.4 oz), SpO2 95.00%. General appearance: alert and cooperative Resp: clear to auscultation bilaterally Cardio: regular rate and rhythm, S1, S2 normal, no murmur, click, rub or gallop  Disposition: 01-Home or Self Care   Medication List  As of 09/17/2011  7:56 AM   TAKE these medications         amLODipine 2.5 MG tablet   Commonly known as: NORVASC   Take 2.5 mg by mouth daily.      atenolol 100 MG tablet   Commonly known as: TENORMIN   Take 100 mg by mouth daily.      DSS 100 MG Caps   Take 100 mg by mouth 2 (two) times daily.      ferrous sulfate 325 (65 FE) MG tablet   Take 1 tablet (325 mg total) by mouth daily with breakfast.      HYDROcodone-acetaminophen 5-325 MG per tablet   Commonly known as: NORCO   Take 1-2 tablets by mouth every 6 (six) hours as needed for pain.      mulitivitamin with minerals Tabs   Take 1 tablet by mouth daily.      polyethylene glycol packet   Commonly known as: MIRALAX / GLYCOLAX   Take 17 g by mouth daily as needed.           Follow-up Information    Follow up with Lillia Mountain, MD in 5 weeks.         SignedLillia Mountain 09/17/2011, 7:56 AM

## 2011-09-17 NOTE — Progress Notes (Signed)
Gerre Pebbles to be D/C'd Rehab per MD order.  Discussed with the patient and all questions fully answered.   Kani, Jobson  Home Medication Instructions [id number]   Printed on:09/17/11 [phone]  Medication Information                    atenolol (TENORMIN) 100 MG tablet Take 100 mg by mouth daily.           Multiple Vitamin (MULITIVITAMIN WITH MINERALS) TABS Take 1 tablet by mouth daily.           amLODipine (NORVASC) 2.5 MG tablet Take 2.5 mg by mouth daily.           HYDROcodone-acetaminophen (NORCO) 5-325 MG per tablet Take 1-2 tablets by mouth every 6 (six) hours as needed for pain.           ferrous sulfate 325 (65 FE) MG tablet Take 1 tablet (325 mg total) by mouth daily with breakfast.           polyethylene glycol (MIRALAX / GLYCOLAX) packet Take 17 g by mouth daily as needed.           docusate sodium 100 MG CAPS Take 100 mg by mouth 2 (two) times daily.             VVS, Skin clean, dry and intact without evidence of skin break down, no evidence of skin tears noted. Two scabs on left buttocks.  IV catheter discontinued intact. Site without signs and symptoms of complications. Dressing and pressure applied.  An After Visit Summary was printed and given to the patient. Patient escorted via WC, and D/C to [facility] via private auto.  Kennyth Arnold D 09/17/2011 8:32 AM

## 2011-09-17 NOTE — Progress Notes (Signed)
Patient is medically stable for discharge today and facility chosen is [facility]. Patient's son completing admission paperwork at skilled nursing facility. CSW facilitated discharge to SNF via ambulance.  Discharge paperwork forwarded to facility.  Genelle Bal, MSW, LCSW [phone]

## 2012-08-08 ENCOUNTER — Emergency Department (HOSPITAL_COMMUNITY)
Admission: EM | Admit: 2012-08-08 | Discharge: 2012-08-08 | Disposition: A | Discharge status: Home / Self Care | Payer: Medicare Other | Attending: Emergency Medicine | Admitting: Emergency Medicine

## 2012-08-08 ENCOUNTER — Emergency Department (INDEPENDENT_AMBULATORY_CARE_PROVIDER_SITE_OTHER)
Admission: EM | Admit: 2012-08-08 | Discharge: 2012-08-08 | Disposition: A | Discharge status: Other Acute Inpatient Hospital | Payer: Medicare Other | Source: Home / Self Care | Attending: Family Medicine | Admitting: Family Medicine

## 2012-08-08 ENCOUNTER — Encounter (HOSPITAL_COMMUNITY): Payer: Self-pay

## 2012-08-08 ENCOUNTER — Encounter (HOSPITAL_COMMUNITY): Payer: Self-pay | Admitting: Emergency Medicine

## 2012-08-08 DIAGNOSIS — Z79899 Other long term (current) drug therapy: Secondary | ICD-10-CM | POA: Insufficient documentation

## 2012-08-08 DIAGNOSIS — Z85038 Personal history of other malignant neoplasm of large intestine: Secondary | ICD-10-CM | POA: Insufficient documentation

## 2012-08-08 DIAGNOSIS — R42 Dizziness and giddiness: Secondary | ICD-10-CM | POA: Insufficient documentation

## 2012-08-08 DIAGNOSIS — I10 Essential (primary) hypertension: Secondary | ICD-10-CM | POA: Insufficient documentation

## 2012-08-08 DIAGNOSIS — R197 Diarrhea, unspecified: Secondary | ICD-10-CM | POA: Insufficient documentation

## 2012-08-08 DIAGNOSIS — Z8739 Personal history of other diseases of the musculoskeletal system and connective tissue: Secondary | ICD-10-CM | POA: Insufficient documentation

## 2012-08-08 DIAGNOSIS — R110 Nausea: Secondary | ICD-10-CM | POA: Insufficient documentation

## 2012-08-08 LAB — CBC WITH DIFFERENTIAL/PLATELET
Basophils Absolute: 0 10*3/uL (ref 0.0–0.1)
Eosinophils Absolute: 0.3 10*3/uL (ref 0.0–0.7)
Lymphs Abs: 1.9 10*3/uL (ref 0.7–4.0)
MCH: 31.6 pg (ref 26.0–34.0)
Neutrophils Relative %: 66 % (ref 43–77)
Platelets: 275 10*3/uL (ref 150–400)
RBC: 4.37 MIL/uL (ref 3.87–5.11)
RDW: 13.2 % (ref 11.5–15.5)
WBC: 7.9 10*3/uL (ref 4.0–10.5)

## 2012-08-08 LAB — URINALYSIS, ROUTINE W REFLEX MICROSCOPIC
Leukocytes, UA: NEGATIVE
Nitrite: NEGATIVE
Protein, ur: NEGATIVE mg/dL
Specific Gravity, Urine: 1.016 (ref 1.005–1.030)
Urobilinogen, UA: 0.2 mg/dL (ref 0.0–1.0)

## 2012-08-08 LAB — URINE MICROSCOPIC-ADD ON

## 2012-08-08 LAB — BASIC METABOLIC PANEL
Calcium: 10.4 mg/dL (ref 8.4–10.5)
GFR calc Af Amer: 86 mL/min — ABNORMAL LOW (ref >90)
GFR calc non Af Amer: 74 mL/min — ABNORMAL LOW (ref >90)
Glucose, Bld: 95 mg/dL (ref 70–99)
Potassium: 3.5 mEq/L (ref 3.5–5.1)
Sodium: 139 mEq/L (ref 135–145)

## 2012-08-08 NOTE — ED Notes (Signed)
RUE:AV40<JW> Expected date:08/08/12<BR> Expected time:12:37 PM<BR> Means of arrival:<BR> Comments:<BR> [place]

## 2012-08-08 NOTE — ED Notes (Signed)
Dizzy 2 days ago after exercise class, resolves w/o tx, none yesterday, repeat today while in yoga class. NAD at present, w/d/color good, ; no change when she closes her eyes, or moves her head. Increase on her amlodipine from 2.5 to 5 mg on 2-6 , no incident of dizzy until 2 days ago; eating and drinking as per usual

## 2012-08-08 NOTE — ED Provider Notes (Signed)
History     CSN: [id number]  Arrival date & time 08/08/12  1220   None     Chief Complaint  Patient presents with  . Dizziness    (Consider location/radiation/quality/duration/timing/severity/associated sxs/prior treatment) HPI Comments: Pt reports walking 2 miles every day.  2 days ago, immediately after finishing walk (but not during walk) pt felt lightheaded, so she took 2 tylenol and laid down to rest. After 2 hours, she got up and felt fine.  No problems with 2 mile walk yesterday.  Today did not walk but went to a yoga class instead- has only been to yoga once before.  During class, especially when turning head, felt dizzy.  Came here.  Not dizzy now.  Feels fine now.   Patient is a 79 y.o. female presenting with neurologic complaint. The history is provided by the patient.  Neurologic Problem The primary symptoms include headaches and dizziness. Primary symptoms do not include syncope, loss of consciousness, altered mental status, visual change, speech change or fever. The symptoms began 2 to 6 hours ago. The symptoms are resolved. The symptoms occurred on exertion.  The headache is not associated with visual change, neck stiffness or weakness.  Dizziness does not occur with weakness.  Additional symptoms do not include neck stiffness or weakness.    Past Medical History  Diagnosis Date  . Hypertension   . Osteoarthritis   . Colon cancer     Past Surgical History  Procedure Laterality Date  . Colon surgery      History reviewed. No pertinent family history.  History  Substance Use Topics  . Smoking status: Never Smoker   . Smokeless tobacco: Not on file  . Alcohol Use: No    OB History   Grav Para Term Preterm Abortions TAB SAB Ect Mult Living                  Review of Systems  Constitutional: Negative for fever and chills.  HENT: Negative for ear pain, congestion, rhinorrhea and neck stiffness.   Cardiovascular: Negative for chest pain, palpitations and  syncope.  Neurological: Positive for dizziness, light-headedness and headaches. Negative for speech change, loss of consciousness, syncope, facial asymmetry, speech difficulty, weakness and numbness.  Psychiatric/Behavioral: Negative for altered mental status.    Allergies  Neosporin and Sulfa antibiotics  Home Medications   Current Outpatient Rx  Name  Route  Sig  Dispense  Refill  . amLODipine (NORVASC) 2.5 MG tablet   Oral   Take 5 mg by mouth daily.          Marland Kitchen atenolol (TENORMIN) 100 MG tablet   Oral   Take 100 mg by mouth daily.         . ferrous sulfate 325 (65 FE) MG tablet   Oral   Take 1 tablet (325 mg total) by mouth daily with breakfast.   30 tablet   0   . Multiple Vitamin (MULITIVITAMIN WITH MINERALS) TABS   Oral   Take 1 tablet by mouth daily.           BP 151/76  Pulse 74  Temp(Src) 97.9 F (36.6 C) (Oral)  Resp 20  SpO2 93%  Physical Exam  Constitutional: She is oriented to person, place, and time. She appears well-developed and well-nourished. She is cooperative. She does not appear ill. No distress.  HENT:  Right Ear: Tympanic membrane, external ear and ear canal normal.  Left Ear: Tympanic membrane, external ear and ear canal normal.  Eyes: Pupils are equal, round, and reactive to light.  Cardiovascular: Normal rate and regular rhythm.   bp recheck by me was 143/67  Pulmonary/Chest: Effort normal and breath sounds normal.  Neurological: She is alert and oriented to person, place, and time. Gait normal.  Grossly neuro intact. No asymmetry.     ED Course  Procedures (including critical care time)  Labs Reviewed - No data to display No results found.   1. Dizziness       MDM  Pt with dizziness after walking 2 days ago, and today during yoga class.  To er for further eval of dizziness. EKG sinus brady at 59. No orthostatic VS here.         Maureen Parsons, NP 08/08/12 1433

## 2012-08-08 NOTE — ED Notes (Signed)
The pt is tired of waiting she was sent down from [facility] and she is thinking that she needs to go home.  Will get back asap

## 2012-08-08 NOTE — ED Notes (Signed)
Pt sent here from [facility] for eval of episode of dizziness while exercising today that is now resolved as well as an episode on Wednesday; pt denies complaint at present

## 2012-08-08 NOTE — ED Notes (Signed)
MD at bedside. 

## 2012-08-08 NOTE — ED Provider Notes (Signed)
Medical screening examination/treatment/procedure(s) were performed by resident physician or non-physician practitioner and as supervising physician I was immediately available for consultation/collaboration.   KINDL,JAMES DOUGLAS MD.   James D Kindl, MD 08/08/12 1459 

## 2012-08-08 NOTE — ED Notes (Signed)
Advised of the wait

## 2012-08-08 NOTE — ED Notes (Signed)
Of the waittime

## 2012-08-08 NOTE — ED Notes (Signed)
The pt tired of waiting  2 in front of her

## 2012-08-09 NOTE — ED Provider Notes (Signed)
History     CSN: [id number]  Arrival date & time 08/08/12  1447   First MD Initiated Contact with Patient 08/08/12 1945      Chief Complaint  Patient presents with  . Dizziness    HPI Patient is a 79 year old female presents to the ED with 2 episodes of dizziness over the last 3 days. Reports the episodes last approximately 15 minutes or less. Initial episode came on at rest. Feels as if the room is spinning. Also notes that her vision is slightly blurry during these episodes. No fevers. No headache. Number next if. No unilateral weakness or numbness. No other symptoms. Was seen over at urgent care today and sent here for further evaluation  Past Medical History  Diagnosis Date  . Hypertension   . Osteoarthritis   . Colon cancer     Past Surgical History  Procedure Laterality Date  . Colon surgery      History reviewed. No pertinent family history.  History  Substance Use Topics  . Smoking status: Never Smoker   . Smokeless tobacco: Not on file  . Alcohol Use: No    OB History   Grav Para Term Preterm Abortions TAB SAB Ect Mult Living                  Review of Systems  Constitutional: Negative for fever and chills.  HENT: Negative for congestion, rhinorrhea, neck pain and neck stiffness.   Respiratory: Negative for cough and shortness of breath.   Cardiovascular: Negative for chest pain.  Gastrointestinal: Positive for nausea, abdominal pain and diarrhea. Negative for vomiting and abdominal distention.  Endocrine: Negative for polyuria.  Genitourinary: Negative for dysuria.  Skin: Negative for rash.  Neurological: Positive for dizziness. Negative for tremors, seizures, syncope, facial asymmetry, speech difficulty, weakness, light-headedness, numbness and headaches.  Psychiatric/Behavioral: Negative.   All other systems reviewed and are negative.    Allergies  Neosporin and Sulfa antibiotics  Home Medications   Current Outpatient Rx  Name  Route  Sig   Dispense  Refill  . amLODipine (NORVASC) 5 MG tablet   Oral   Take 5 mg by mouth daily.         Marland Kitchen atenolol (TENORMIN) 100 MG tablet   Oral   Take 100 mg by mouth daily.         Marland Kitchen docusate sodium (COLACE) 100 MG capsule   Oral   Take 100 mg by mouth daily.         . Multiple Vitamin (MULITIVITAMIN WITH MINERALS) TABS   Oral   Take 1 tablet by mouth daily.           BP 154/86  Pulse 60  Temp(Src) 97 F (36.1 C) (Oral)  Resp 11  SpO2 98%  Physical Exam  Nursing note and vitals reviewed. Constitutional: She is oriented to person, place, and time. She appears well-developed and well-nourished. No distress.  HENT:  Head: Normocephalic and atraumatic.  Right Ear: External ear normal.  Left Ear: External ear normal.  Nose: Nose normal.  Mouth/Throat: Oropharynx is clear and moist. No oropharyngeal exudate.  Eyes: EOM are normal. Pupils are equal, round, and reactive to light.  Neck: Normal range of motion. Neck supple. No tracheal deviation present.  Cardiovascular: Normal rate.   Pulmonary/Chest: Effort normal and breath sounds normal. No stridor. No respiratory distress. She has no wheezes. She has no rales.  Abdominal: Soft. She exhibits no distension. There is no tenderness. There is no  rebound.  Musculoskeletal: Normal range of motion.  Neurological: She is alert and oriented to person, place, and time. She has normal strength and normal reflexes. No cranial nerve deficit or sensory deficit. She displays a negative Romberg sign. Coordination and gait normal. GCS eye subscore is 4. GCS verbal subscore is 5. GCS motor subscore is 6.  Skin: Skin is warm and dry. She is not diaphoretic.    ED Course  Procedures (including critical care time)  Labs Reviewed  BASIC METABOLIC PANEL - Abnormal; Notable for the following:    GFR calc non Af Amer 74 (*)    GFR calc Af Amer 86 (*)    All other components within normal limits  URINALYSIS, ROUTINE W REFLEX MICROSCOPIC -  Abnormal; Notable for the following:    APPearance HAZY (*)    Hgb urine dipstick TRACE (*)    All other components within normal limits  URINE MICROSCOPIC-ADD ON - Abnormal; Notable for the following:    Casts HYALINE CASTS (*)    All other components within normal limits  CBC WITH DIFFERENTIAL   No results found.   1. Dizziness       MDM   Patient is an 79 year old female presented to the ED with 2:15 minute episodes of dizziness over the last 3 days. Patient completely neurologically intact on exam. No loss of consciousness during these episodes. A sick labs checked and within normal limits. Discussed case with patient and son and patient not desiring CT of the head as she would not want neurosurgery or any invasive measures if there was something to be found on the scan. Patient reports that she would not want to be admitted to the hospital for this and that she would like to go home as long as everything looks okay. He'll this is a reasonable decision given the patient's age and comorbidities. Patient safe for discharge. Patient discharged      Arloa Koh, MD 08/09/12 [phone]

## 2012-08-09 NOTE — ED Provider Notes (Signed)
I saw and evaluated the patient, reviewed the resident's note and I agree with the findings and plan. The patient presents her for evaluation of episodic dizziness, near syncope.  This has occurred twice over the past two days.  She denies headache, chest pain, or shortness of breath.    On exam, the patient is afebrile and the vitals are stable.  The heart and lung exam is unremarkable.  Neurologically the patient is intact.  Abdomen is benign.  The patient presents here for evaluation after two dizzy spells.  Lab and ekg studies show no acute issues.  CT of the head was considered, however the family does not want to do this.  She is a DNR and do not feel as though any intervention would be wanted even if the ct showed an abnormality.  They want to go home.  I am comfortable with this and they will return to the ER for any problems.   Geoffery Lyons, MD 08/09/12 [phone]

## 2013-08-31 ENCOUNTER — Encounter (HOSPITAL_COMMUNITY): Payer: Self-pay | Admitting: Emergency Medicine

## 2013-08-31 ENCOUNTER — Emergency Department (INDEPENDENT_AMBULATORY_CARE_PROVIDER_SITE_OTHER)
Admission: EM | Admit: 2013-08-31 | Discharge: 2013-08-31 | Disposition: A | Discharge status: Home / Self Care | Payer: Medicare Other | Source: Home / Self Care | Attending: Family Medicine | Admitting: Family Medicine

## 2013-08-31 ENCOUNTER — Emergency Department (INDEPENDENT_AMBULATORY_CARE_PROVIDER_SITE_OTHER): Payer: Medicare Other

## 2013-08-31 DIAGNOSIS — S2231XA Fracture of one rib, right side, initial encounter for closed fracture: Secondary | ICD-10-CM

## 2013-08-31 DIAGNOSIS — S2239XA Fracture of one rib, unspecified side, initial encounter for closed fracture: Secondary | ICD-10-CM

## 2013-08-31 DIAGNOSIS — W010XXA Fall on same level from slipping, tripping and stumbling without subsequent striking against object, initial encounter: Secondary | ICD-10-CM

## 2013-08-31 MED ORDER — HYDROCODONE-ACETAMINOPHEN 5-325 MG PO TABS: 1.0000 | ORAL_TABLET | Freq: Two times a day (BID) | ORAL | Status: DC | PRN

## 2013-08-31 NOTE — Discharge Instructions (Signed)
Rib Fracture  A rib fracture is a break or crack in one of the bones of the ribs. The ribs are a group of long, curved bones that wrap around your chest and attach to your spine. They protect your lungs and other organs in the chest cavity. A broken or cracked rib is often painful, but most do not cause other problems. Most rib fractures heal on their own over time. However, rib fractures can be more serious if multiple ribs are broken or if broken ribs move out of place and push against other structures.  CAUSES   · A direct blow to the chest. For example, this could happen during contact sports, a car accident, or a fall against a hard object.  · Repetitive movements with high force, such as pitching a baseball or having severe coughing spells.  SYMPTOMS   · Pain when you breathe in or cough.  · Pain when someone presses on the injured area.  DIAGNOSIS   Your caregiver will perform a physical exam. Various imaging tests may be ordered to confirm the diagnosis and to look for related injuries. These tests may include a chest X-ray, computed tomography (CT), magnetic resonance imaging (MRI), or a bone scan.  TREATMENT   Rib fractures usually heal on their own in 1 3 months. The longer healing period is often associated with a continued cough or other aggravating activities. During the healing period, pain control is very important. Medication is usually given to control pain. Hospitalization or surgery may be needed for more severe injuries, such as those in which multiple ribs are broken or the ribs have moved out of place.   HOME CARE INSTRUCTIONS   · Avoid strenuous activity and any activities or movements that cause pain. Be careful during activities and avoid bumping the injured rib.  · Gradually increase activity as directed by your caregiver.  · Only take over-the-counter or prescription medications as directed by your caregiver. Do not take other medications without asking your caregiver first.  · Apply ice  to the injured area for the first 1 2 days after you have been treated or as directed by your caregiver. Applying ice helps to reduce inflammation and pain.  · Put ice in a plastic bag.  · Place a towel between your skin and the bag.    · Leave the ice on for 15 20 minutes at a time, every 2 hours while you are awake.  · Perform deep breathing as directed by your caregiver. This will help prevent pneumonia, which is a common complication of a broken rib. Your caregiver may instruct you to:  · Take deep breaths several times a day.  · Try to cough several times a day, holding a pillow against the injured area.  · Use a device called an incentive spirometer to practice deep breathing several times a day.  · Drink enough fluids to keep your urine clear or pale yellow. This will help you avoid constipation.    · Do not wear a rib belt or binder. These restrict breathing, which can lead to pneumonia.    SEEK IMMEDIATE MEDICAL CARE IF:   · You have a fever.    · You have difficulty breathing or shortness of breath.    · You develop a continual cough, or you cough up thick or bloody sputum.  · You feel sick to your stomach (nausea), throw up (vomit), or have abdominal pain.    · You have worsening pain not controlled with medications.      MAKE SURE YOU:  · Understand these instructions.  · Will watch your condition.  · Will get help right away if you are not doing well or get worse.  Document Released: 05/28/2005 Document Revised: 01/28/2013 Document Reviewed: 07/30/2012  [employer]® Patient Information ©2014 [employer].

## 2013-08-31 NOTE — ED Provider Notes (Signed)
CSN: [id number]     Arrival date & time 08/31/13  1015 History   First MD Initiated Contact with Patient 08/31/13 1133     Chief Complaint  Patient presents with  . Fall   (Consider location/radiation/quality/duration/timing/severity/associated sxs/prior Treatment) HPI Comments: Patient states she stumbled and nearly fell coming out of her bathroom on 08-27-2013. caught herself and bumped against her doorframe. Hit her right lateral chest on edge of doorframe and area has remained sore since incident. Denies chest pain or dyspnea, but does endorse that area of injury is sore if she has to take a deep breath. No syncope.   Patient is a 80 y.o. female presenting with fall. The history is provided by the patient.  Fall This is a new problem.    Past Medical History  Diagnosis Date  . Hypertension   . Osteoarthritis   . Colon cancer    Past Surgical History  Procedure Laterality Date  . Colon surgery     History reviewed. No pertinent family history. History  Substance Use Topics  . Smoking status: Never Smoker   . Smokeless tobacco: Not on file  . Alcohol Use: No   OB History   Grav Para Term Preterm Abortions TAB SAB Ect Mult Living                 Review of Systems  All other systems reviewed and are negative.    Allergies  Neosporin and Sulfa antibiotics  Home Medications   Current Outpatient Rx  Name  Route  Sig  Dispense  Refill  . amLODipine (NORVASC) 5 MG tablet   Oral   Take 5 mg by mouth daily.         Marland Kitchen atenolol (TENORMIN) 100 MG tablet   Oral   Take 100 mg by mouth daily.         Marland Kitchen docusate sodium (COLACE) 100 MG capsule   Oral   Take 100 mg by mouth daily.         . Multiple Vitamin (MULITIVITAMIN WITH MINERALS) TABS   Oral   Take 1 tablet by mouth daily.          BP 181/84  Pulse 62  Temp(Src) 97.4 F (36.3 C) (Oral)  Resp 20  SpO2 96% Physical Exam  Nursing note and vitals reviewed. Constitutional: She is oriented to person,  place, and time. She appears well-developed and well-nourished. No distress.  HENT:  Head: Normocephalic and atraumatic.  Right Ear: External ear normal.  Left Ear: External ear normal.  Nose: Nose normal.  Mouth/Throat: Oropharynx is clear and moist.  Eyes: Conjunctivae are normal.  Neck: Normal range of motion and full passive range of motion without pain. Neck supple. No spinous process tenderness and no muscular tenderness present.  Cardiovascular: Normal rate, regular rhythm and normal heart sounds.   Pulmonary/Chest: Effort normal and breath sounds normal. She exhibits tenderness. She exhibits no crepitus, no deformity and no swelling.    Area outlined is area of discomfort with palpation and ROM of RUE.     Abdominal: Soft. Bowel sounds are normal. She exhibits no distension. There is no tenderness.  Musculoskeletal: Normal range of motion.  Neurological: She is alert and oriented to person, place, and time.  Skin: Skin is warm and dry. No rash noted. No erythema.  +intact and without ecchymosis.   Psychiatric: She has a normal mood and affect. Her behavior is normal.    ED Course  Procedures (including critical care  time) Labs Review Labs Reviewed - No data to display Imaging Review Dg Ribs Unilateral W/chest Right  08/31/2013   CLINICAL DATA:  Fall.  EXAM: RIGHT RIBS AND CHEST - 3+ VIEW  COMPARISON:  09/08/2011  FINDINGS: There is a fracture through the lateral right eighth and ninth ribs. No associated pneumothorax. Right base atelectasis. No significant effusion. Heart is normal size. Tortuosity of the thoracic aorta.  IMPRESSION: Right eighth and ninth lateral rib fractures with right base atelectasis.   Electronically Signed   By: Rolm Baptise M.D.   On: 08/31/2013 12:49     MDM   1. Right rib fracture   No associated underlying lung injury. Pain management and recovery at home. Provided son and patient a copy of xray report.   [place], [place] 08/31/13  1319

## 2013-08-31 NOTE — ED Notes (Signed)
Pt   Reports   She  Felled   sev  Days     Ago         And  Injured  Her r  Side    Near  Armpit        Pt  Reports          She  Did not  Black out               She  denys  Any other  Significant  injurys

## 2013-09-02 NOTE — ED Provider Notes (Signed)
Medical screening examination/treatment/procedure(s) were performed by a resident physician or non-physician practitioner and as the supervising physician I was immediately available for consultation/collaboration.  Lynne Leader, MD    Gregor Hams, MD 09/02/13 [phone]

## 2013-10-15 ENCOUNTER — Emergency Department (INDEPENDENT_AMBULATORY_CARE_PROVIDER_SITE_OTHER): Payer: Medicare Other

## 2013-10-15 ENCOUNTER — Emergency Department (INDEPENDENT_AMBULATORY_CARE_PROVIDER_SITE_OTHER)
Admission: EM | Admit: 2013-10-15 | Discharge: 2013-10-15 | Disposition: A | Discharge status: Home / Self Care | Payer: Medicare Other | Source: Home / Self Care | Attending: Family Medicine | Admitting: Family Medicine

## 2013-10-15 ENCOUNTER — Encounter (HOSPITAL_COMMUNITY): Payer: Self-pay | Admitting: Emergency Medicine

## 2013-10-15 DIAGNOSIS — S42253A Displaced fracture of greater tuberosity of unspecified humerus, initial encounter for closed fracture: Secondary | ICD-10-CM

## 2013-10-15 DIAGNOSIS — Y9301 Activity, walking, marching and hiking: Secondary | ICD-10-CM

## 2013-10-15 DIAGNOSIS — W19XXXA Unspecified fall, initial encounter: Secondary | ICD-10-CM

## 2013-10-15 MED ORDER — HYDROCODONE-ACETAMINOPHEN 5-325 MG PO TABS
1.0000 | ORAL_TABLET | Freq: Four times a day (QID) | ORAL | Status: DC | PRN
Start: 2013-10-15 — End: 2014-03-11

## 2013-10-15 NOTE — Discharge Instructions (Signed)
Thank you for coming in today. Follow up with Dr. Berenice Primas next week.  Use the shoulder sling.  Take hydrocodone very sparingly.  Hydrocodone will increase her risk of falls and dizziness and confusion. Make sure you get lots of help.  Fall Prevention and Home Safety Falls cause injuries and can affect all age groups. It is possible to prevent falls.  HOW TO PREVENT FALLS  Wear shoes with rubber soles that do not have an opening for your toes.  Keep the inside and outside of your house well lit.  Use night lights throughout your home.  Remove clutter from floors.  Clean up floor spills.  Remove throw rugs or fasten them to the floor with carpet tape.  Do not place electrical cords across pathways.  Put grab bars by your tub, shower, and toilet. Do not use towel bars as grab bars.  Put handrails on both sides of the stairway. Fix loose handrails.  Do not climb on stools or stepladders, if possible.  Do not wax your floors.  Repair uneven or unsafe sidewalks, walkways, or stairs.  Keep items you use a lot within reach.  Be aware of pets.  Keep emergency numbers next to the telephone.  Put smoke detectors in your home and near bedrooms. Ask your doctor what other things you can do to prevent falls. Document Released: 03/24/2009 Document Revised: 11/27/2011 Document Reviewed: 08/28/2011 [facility] Patient Information 2014 [place], [place].

## 2013-10-15 NOTE — ED Provider Notes (Signed)
Maureen Weiss is a 80 y.o. female who presents to Urgent Care today for right arm and wrist pain. Patient fell yesterday while walking. She suffered a mechanical fall. She landed on her arm. She notes mild wrist pain and moderate to severe shoulder pain. The shoulder pain is worse with activity. There is pain is also somewhat mildly worse with activity. She has tried oxycodone which has helped a bit to control pain. She denies any radiating pain weakness or numbness. She denies any head injury loss of consciousness. She feels well otherwise.   Past Medical History  Diagnosis Date  . Hypertension   . Osteoarthritis   . Colon cancer    History  Substance Use Topics  . Smoking status: Never Smoker   . Smokeless tobacco: Not on file  . Alcohol Use: No   ROS as above Medications: No current facility-administered medications for this encounter.   Current Outpatient Prescriptions  Medication Sig Dispense Refill  . amLODipine (NORVASC) 5 MG tablet Take 5 mg by mouth daily.      Marland Kitchen atenolol (TENORMIN) 100 MG tablet Take 100 mg by mouth daily.      Marland Kitchen docusate sodium (COLACE) 100 MG capsule Take 100 mg by mouth daily.      Marland Kitchen HYDROcodone-acetaminophen (NORCO/VICODIN) 5-325 MG per tablet Take 1 tablet by mouth every 6 (six) hours as needed.  15 tablet  0  . Multiple Vitamin (MULITIVITAMIN WITH MINERALS) TABS Take 1 tablet by mouth daily.        Exam:  BP 168/76  Pulse 69  Temp(Src) 98.5 F (36.9 C) (Oral)  Resp 18  SpO2 97% Gen: Well NAD HEENT: EOMI,  MMM Lungs: Normal work of breathing. CTABL Heart: RRR no MRG Abd: NABS, Soft. NT, ND Exts: Brisk capillary refill, warm and well perfused.  Right shoulder: Normal-appearing. Tender palpation along the bicipital groove. Pain with abduction and impingement. Strength is intact as is capillary refill and sensation.  Right wrist: Degenerative in appearance. Mildly tender diffusely. Full wrist range of motion. Grip strength sensation and refill  intact distally.  No results found for this or any previous visit (from the past 24 hour(s)). Dg Shoulder Right  10/15/2013   CLINICAL DATA:  Arm pain.  Fell on the right shoulder.  EXAM: RIGHT SHOULDER - 2+ VIEW  COMPARISON:  None.  FINDINGS: AP, transscapular Y, and axillary views are submitted. There is marked motion on the transscapular Y-view. On the axillary view, the humeral head is confirmed to be located within the glenoid fossa. There is an acute fracture of the greater tuberosity, best seen on the axillary view. On the axillary view, there appears to be a 1.4 x 0.4 cm bony fragment, displaced by approximately 2 mm. A subtle lucency and cortical irregularity is also seen along the greater tuberosity on the frontal projection.  IMPRESSION:  Acute, minimally displaced greater tuberosity fracture.  Transscapular Y-view limited by patient motion.   Electronically Signed   By: Curlene Dolphin M.D.   On: 10/15/2013 15:08   Dg Wrist Complete Right  10/15/2013   CLINICAL DATA:  Fall, pain.  EXAM: RIGHT WRIST - COMPLETE 3+ VIEW  COMPARISON:  None.  FINDINGS: No acute fracture deformity or dislocation. Severe volar carpal-carpal and carpometacarpal osteoarthrosis. Bone mineral density is decreased without destructive bony lesions. Soft tissue planes are nonsuspicious.  IMPRESSION: No acute fracture deformity or dislocation; osteopenia degraded sensitivity for acute nondisplaced fracture.   Electronically Signed   By: Elon Alas  On: 10/15/2013 15:22    Assessment and Plan: 80 y.o. female with right shoulder greater tuberosity fracture. Nondisplaced. Discussed with Dr. Berenice Primas. Left shoulder immobilizer, Norco for pain control and followup with orthopedics in about one week. Warned patient and her son about possible confusion and fall risk with Norco.  Discussed warning signs or symptoms. Please see discharge instructions. Patient expresses understanding.    Gregor Hams, MD 10/15/13 [phone]

## 2013-10-15 NOTE — ED Notes (Signed)
C/o arm pain and wrist pain See physician note

## 2014-03-06 ENCOUNTER — Emergency Department (HOSPITAL_COMMUNITY): Payer: Medicare Other

## 2014-03-06 ENCOUNTER — Encounter (HOSPITAL_COMMUNITY): Payer: Self-pay | Admitting: Emergency Medicine

## 2014-03-06 ENCOUNTER — Inpatient Hospital Stay (HOSPITAL_COMMUNITY): Payer: Medicare Other

## 2014-03-06 ENCOUNTER — Inpatient Hospital Stay (HOSPITAL_COMMUNITY)
Admission: EM | Admit: 2014-03-06 | Discharge: 2014-03-11 | DRG: 064 | Disposition: A | Discharge status: Hospice | Payer: Medicare Other | Attending: Neurology | Admitting: Neurology

## 2014-03-06 DIAGNOSIS — Z853 Personal history of malignant neoplasm of breast: Secondary | ICD-10-CM | POA: Diagnosis not present

## 2014-03-06 DIAGNOSIS — I629 Nontraumatic intracranial hemorrhage, unspecified: Secondary | ICD-10-CM

## 2014-03-06 DIAGNOSIS — I10 Essential (primary) hypertension: Secondary | ICD-10-CM | POA: Diagnosis present

## 2014-03-06 DIAGNOSIS — I615 Nontraumatic intracerebral hemorrhage, intraventricular: Secondary | ICD-10-CM | POA: Diagnosis present

## 2014-03-06 DIAGNOSIS — R0600 Dyspnea, unspecified: Secondary | ICD-10-CM

## 2014-03-06 DIAGNOSIS — R1310 Dysphagia, unspecified: Secondary | ICD-10-CM | POA: Diagnosis present

## 2014-03-06 DIAGNOSIS — E876 Hypokalemia: Secondary | ICD-10-CM | POA: Diagnosis not present

## 2014-03-06 DIAGNOSIS — G919 Hydrocephalus, unspecified: Secondary | ICD-10-CM | POA: Diagnosis not present

## 2014-03-06 DIAGNOSIS — Z66 Do not resuscitate: Secondary | ICD-10-CM

## 2014-03-06 DIAGNOSIS — Z681 Body mass index (BMI) 19 or less, adult: Secondary | ICD-10-CM | POA: Diagnosis not present

## 2014-03-06 DIAGNOSIS — M1990 Unspecified osteoarthritis, unspecified site: Secondary | ICD-10-CM | POA: Diagnosis present

## 2014-03-06 DIAGNOSIS — Z85038 Personal history of other malignant neoplasm of large intestine: Secondary | ICD-10-CM

## 2014-03-06 DIAGNOSIS — J9601 Acute respiratory failure with hypoxia: Secondary | ICD-10-CM | POA: Diagnosis present

## 2014-03-06 DIAGNOSIS — I619 Nontraumatic intracerebral hemorrhage, unspecified: Secondary | ICD-10-CM | POA: Diagnosis present

## 2014-03-06 DIAGNOSIS — R55 Syncope and collapse: Secondary | ICD-10-CM | POA: Diagnosis present

## 2014-03-06 DIAGNOSIS — R40243 Glasgow coma scale score 3-8, unspecified time: Secondary | ICD-10-CM

## 2014-03-06 DIAGNOSIS — Z79899 Other long term (current) drug therapy: Secondary | ICD-10-CM | POA: Diagnosis not present

## 2014-03-06 DIAGNOSIS — E43 Unspecified severe protein-calorie malnutrition: Secondary | ICD-10-CM | POA: Diagnosis present

## 2014-03-06 DIAGNOSIS — R52 Pain, unspecified: Secondary | ICD-10-CM

## 2014-03-06 DIAGNOSIS — J690 Pneumonitis due to inhalation of food and vomit: Secondary | ICD-10-CM | POA: Diagnosis present

## 2014-03-06 DIAGNOSIS — R739 Hyperglycemia, unspecified: Secondary | ICD-10-CM | POA: Diagnosis present

## 2014-03-06 DIAGNOSIS — I610 Nontraumatic intracerebral hemorrhage in hemisphere, subcortical: Secondary | ICD-10-CM

## 2014-03-06 DIAGNOSIS — R4020 Unspecified coma: Secondary | ICD-10-CM

## 2014-03-06 DIAGNOSIS — G9340 Encephalopathy, unspecified: Secondary | ICD-10-CM | POA: Diagnosis present

## 2014-03-06 DIAGNOSIS — Z515 Encounter for palliative care: Secondary | ICD-10-CM

## 2014-03-06 DIAGNOSIS — R40244 Other coma, without documented Glasgow coma scale score, or with partial score reported, unspecified time: Secondary | ICD-10-CM

## 2014-03-06 LAB — I-STAT ARTERIAL BLOOD GAS, ED
Acid-Base Excess: 2 mmol/L (ref 0.0–2.0)
Bicarbonate: 25.8 mEq/L — ABNORMAL HIGH (ref 20.0–24.0)
O2 SAT: 99 %
PCO2 ART: 36.2 mmHg (ref 35.0–45.0)
PO2 ART: 125 mmHg — AB (ref 80.0–100.0)
TCO2: 27 mmol/L (ref 0–100)
pH, Arterial: 7.46 — ABNORMAL HIGH (ref 7.350–7.450)

## 2014-03-06 LAB — URINALYSIS, ROUTINE W REFLEX MICROSCOPIC
Bilirubin Urine: NEGATIVE
GLUCOSE, UA: 250 mg/dL — AB
KETONES UR: NEGATIVE mg/dL
Leukocytes, UA: NEGATIVE
Nitrite: NEGATIVE
PROTEIN: NEGATIVE mg/dL
Specific Gravity, Urine: 1.007 (ref 1.005–1.030)
Urobilinogen, UA: 0.2 mg/dL (ref 0.0–1.0)
pH: 7.5 (ref 5.0–8.0)

## 2014-03-06 LAB — CBC WITH DIFFERENTIAL/PLATELET
BASOS PCT: 0 % (ref 0–1)
Basophils Absolute: 0 10*3/uL (ref 0.0–0.1)
EOS ABS: 0 10*3/uL (ref 0.0–0.7)
EOS PCT: 0 % (ref 0–5)
HEMATOCRIT: 42.5 % (ref 36.0–46.0)
Hemoglobin: 14.1 g/dL (ref 12.0–15.0)
Lymphocytes Relative: 5 % — ABNORMAL LOW (ref 12–46)
Lymphs Abs: 0.7 10*3/uL (ref 0.7–4.0)
MCH: 30.3 pg (ref 26.0–34.0)
MCHC: 33.2 g/dL (ref 30.0–36.0)
MCV: 91.4 fL (ref 78.0–100.0)
MONO ABS: 0.6 10*3/uL (ref 0.1–1.0)
Monocytes Relative: 4 % (ref 3–12)
NEUTROS PCT: 91 % — AB (ref 43–77)
Neutro Abs: 11.9 10*3/uL — ABNORMAL HIGH (ref 1.7–7.7)
Platelets: 264 10*3/uL (ref 150–400)
RBC: 4.65 MIL/uL (ref 3.87–5.11)
RDW: 13.7 % (ref 11.5–15.5)
WBC: 13.2 10*3/uL — ABNORMAL HIGH (ref 4.0–10.5)

## 2014-03-06 LAB — COMPREHENSIVE METABOLIC PANEL
ALBUMIN: 3.9 g/dL (ref 3.5–5.2)
ALT: 19 U/L (ref 0–35)
ANION GAP: 16 — AB (ref 5–15)
AST: 30 U/L (ref 0–37)
Alkaline Phosphatase: 67 U/L (ref 39–117)
BILIRUBIN TOTAL: 0.4 mg/dL (ref 0.3–1.2)
BUN: 10 mg/dL (ref 6–23)
CO2: 25 mEq/L (ref 19–32)
CREATININE: 0.51 mg/dL (ref 0.50–1.10)
Calcium: 9.4 mg/dL (ref 8.4–10.5)
Chloride: 97 mEq/L (ref 96–112)
GFR calc Af Amer: >90 mL/min (ref >90)
GFR calc non Af Amer: 82 mL/min — ABNORMAL LOW (ref >90)
GLUCOSE: 158 mg/dL — AB (ref 70–99)
POTASSIUM: 3.7 meq/L (ref 3.7–5.3)
Sodium: 138 mEq/L (ref 137–147)
TOTAL PROTEIN: 7.5 g/dL (ref 6.0–8.3)

## 2014-03-06 LAB — I-STAT TROPONIN, ED: TROPONIN I, POC: 0.01 ng/mL (ref 0.00–0.08)

## 2014-03-06 LAB — I-STAT CG4 LACTIC ACID, ED: LACTIC ACID, VENOUS: 3.44 mmol/L — AB (ref 0.5–2.2)

## 2014-03-06 LAB — MRSA PCR SCREENING: MRSA by PCR: NEGATIVE

## 2014-03-06 LAB — CK: Total CK: 98 U/L (ref 7–177)

## 2014-03-06 LAB — URINE MICROSCOPIC-ADD ON

## 2014-03-06 MED ORDER — ROCURONIUM BROMIDE 50 MG/5ML IV SOLN
INTRAVENOUS | Status: AC
  Filled 2014-03-06: qty 2

## 2014-03-06 MED ORDER — ACETAMINOPHEN 650 MG RE SUPP
650.0000 mg | RECTAL | Status: DC | PRN
  Administered 2014-03-06 – 2014-03-10 (×5): 650 mg via RECTAL
  Filled 2014-03-06 (×6): qty 1

## 2014-03-06 MED ORDER — PANTOPRAZOLE SODIUM 40 MG IV SOLR: 40.0000 mg | Freq: Every day | INTRAVENOUS | Status: DC

## 2014-03-06 MED ORDER — SODIUM CHLORIDE 0.9 % IV SOLN
1.5000 g | Freq: Once | INTRAVENOUS | Status: AC
  Administered 2014-03-06: 1.5 g via INTRAVENOUS
  Filled 2014-03-06: qty 1.5

## 2014-03-06 MED ORDER — SODIUM CHLORIDE 0.9 % IV SOLN
1.5000 g | Freq: Three times a day (TID) | INTRAVENOUS | Status: DC
  Administered 2014-03-07 – 2014-03-11 (×14): 1.5 g via INTRAVENOUS
  Filled 2014-03-06 (×18): qty 1.5

## 2014-03-06 MED ORDER — ALBUTEROL SULFATE (2.5 MG/3ML) 0.083% IN NEBU
2.5000 mg | INHALATION_SOLUTION | RESPIRATORY_TRACT | Status: DC
  Administered 2014-03-06 – 2014-03-10 (×21): 2.5 mg via RESPIRATORY_TRACT
  Filled 2014-03-06 (×20): qty 3

## 2014-03-06 MED ORDER — ETOMIDATE 2 MG/ML IV SOLN
INTRAVENOUS | Status: AC
  Administered 2014-03-06: 20 mg
  Filled 2014-03-06: qty 20

## 2014-03-06 MED ORDER — NICARDIPINE HCL IN NACL 20-0.86 MG/200ML-% IV SOLN
3.0000 mg/h | Freq: Once | INTRAVENOUS | Status: AC
  Administered 2014-03-06: 3 mg/h via INTRAVENOUS
  Filled 2014-03-06: qty 200

## 2014-03-06 MED ORDER — LIDOCAINE HCL (CARDIAC) 20 MG/ML IV SOLN
INTRAVENOUS | Status: AC
  Filled 2014-03-06: qty 5

## 2014-03-06 MED ORDER — STROKE: EARLY STAGES OF RECOVERY BOOK
Freq: Once | Status: DC
  Filled 2014-03-06: qty 1

## 2014-03-06 MED ORDER — SODIUM CHLORIDE 0.9 % IV BOLUS (SEPSIS)
1000.0000 mL | Freq: Once | INTRAVENOUS | Status: AC
  Administered 2014-03-06: 1000 mL via INTRAVENOUS

## 2014-03-06 MED ORDER — SUCCINYLCHOLINE CHLORIDE 20 MG/ML IJ SOLN
INTRAMUSCULAR | Status: AC
  Filled 2014-03-06: qty 1

## 2014-03-06 MED ORDER — PANTOPRAZOLE SODIUM 40 MG IV SOLR
40.0000 mg | Freq: Every day | INTRAVENOUS | Status: DC
  Administered 2014-03-06 – 2014-03-10 (×5): 40 mg via INTRAVENOUS
  Filled 2014-03-06 (×6): qty 40

## 2014-03-06 MED ORDER — CETYLPYRIDINIUM CHLORIDE 0.05 % MT LIQD
7.0000 mL | Freq: Four times a day (QID) | OROMUCOSAL | Status: DC
  Administered 2014-03-06 – 2014-03-11 (×19): 7 mL via OROMUCOSAL

## 2014-03-06 MED ORDER — ROCURONIUM BROMIDE 50 MG/5ML IV SOLN
INTRAVENOUS | Status: AC
  Administered 2014-03-06: 80 mg
  Filled 2014-03-06: qty 2

## 2014-03-06 MED ORDER — SENNOSIDES-DOCUSATE SODIUM 8.6-50 MG PO TABS
1.0000 | ORAL_TABLET | Freq: Two times a day (BID) | ORAL | Status: DC
  Filled 2014-03-06 (×11): qty 1

## 2014-03-06 MED ORDER — NICARDIPINE HCL IN NACL 20-0.86 MG/200ML-% IV SOLN
3.0000 mg/h | INTRAVENOUS | Status: DC
  Administered 2014-03-06: 3 mg/h via INTRAVENOUS
  Administered 2014-03-06 – 2014-03-08 (×5): 5 mg/h via INTRAVENOUS
  Filled 2014-03-06 (×4): qty 200

## 2014-03-06 MED ORDER — ACETAMINOPHEN 325 MG PO TABS: 650.0000 mg | ORAL_TABLET | ORAL | Status: DC | PRN

## 2014-03-06 MED ORDER — SODIUM CHLORIDE 0.9 % IV SOLN
INTRAVENOUS | Status: DC
  Administered 2014-03-07 – 2014-03-09 (×4): via INTRAVENOUS

## 2014-03-06 MED ORDER — ETOMIDATE 2 MG/ML IV SOLN
INTRAVENOUS | Status: AC
  Filled 2014-03-06: qty 20

## 2014-03-06 MED ORDER — CHLORHEXIDINE GLUCONATE 0.12 % MT SOLN
15.0000 mL | Freq: Two times a day (BID) | OROMUCOSAL | Status: DC
  Administered 2014-03-06 – 2014-03-11 (×10): 15 mL via OROMUCOSAL
  Filled 2014-03-06 (×12): qty 15

## 2014-03-06 NOTE — ED Notes (Addendum)
Dr. Doy Mince at the bedside. Attempted to advance OG tube. Pt pushing against tube with her tongue. Bright red blood in OG tube at this time. MD to be made aware.

## 2014-03-06 NOTE — H&P (Addendum)
Admission H&P    Chief Complaint: Unresponsive HPI: Maureen Weiss is an 80 y.o. female who lives alone and is able to perform all of her ADL's.  Was fine when she talked to her son at 42 to tell him that the power was out.  When he called to check on her later in the day she did not answer.  When he went to the house she was in the bed and unresponsive.  EMS was called at that time and he attempted to start chest compressions prior to their arrival.  Patient was brought in for evaluation and was not able to protect her airway.  She required intubation at that time.    It is unclear if the patient was taking her antihypertensives at home.    Date last known well: Date: 03/06/2014 Time last known well: Time: 03:30 tPA Given: No: ICH  Past Medical History  Diagnosis Date  . Hypertension   . Osteoarthritis   . Colon cancer     Past Surgical History  Procedure Laterality Date  . Colon surgery      Family history: Unclear what parents died of.  Has three sisters.  All are deceased.  One of them died of Alzheimer's.  No information on the other two is known.  Has a son with hypertension and hypercholesterolemia.   Social History:  reports that she has never smoked. She does not have any smokeless tobacco history on file. She reports that she does not drink alcohol or use illicit drugs.  Allergies:  Allergies  Allergen Reactions  . Neosporin [Neomycin-Polymyxin-Gramicidin]     Unknown  . Sulfa Antibiotics     Unknown   Medications: Current outpatient prescriptions: atenolol (TENORMIN) 100 MG tablet, Take 100 mg by mouth daily., Disp: , Rfl: ;   DULoxetine (CYMBALTA) 30 MG capsule, Take 30 mg by mouth daily., Disp: , Rfl: ;   HYDROcodone-acetaminophen (NORCO/VICODIN) 5-325 MG per tablet, Take 1 tablet by mouth every 6 (six) hours as needed., Disp: 15 tablet, Rfl: 0;   Multiple Vitamin (MULITIVITAMIN WITH MINERALS) TABS, Take 1 tablet by mouth daily., Disp: , Rfl:   ROS: Unable to  obtain  Physical Examination: Blood pressure 179/76, pulse 84, temperature 97.7 F (36.5 C), resp. rate 14, height 4' 10.5" (1.486 m), SpO2 98.00%.  General Examination: HEENT-  Normocephalic, no lesions, without obvious abnormality.  Normal external eye and conjunctiva.  Normal TM's bilaterally.  Normal auditory canals and external ears. Normal external nose, mucus membranes and septum.  Normal pharynx. Neck supple with no masses, nodes, nodules or enlargement. Cardiovascular - S1, S2 normal Lungs - chest clear, no wheezing, rales, normal symmetric air entry Abdomen - soft and nontender with normal bowel sounds.  Large left mass noted that is hard but mobile.   Extremities - no edema  Neurologic Examination: Mental Status: Patient does not respond to verbal stimuli.  With deep sternal rub appears to initiate posturing in the upper extremities.  Does not follow commands.  No verbalizations noted.  Cranial Nerves: II: patient does not respond confrontation bilaterally, pupils right 2 mm, left 2 mm,and sluggishly reactive bilaterally III,IV,VI: doll's response absent bilaterally.  V,VII: corneal reflex absent bilaterally  VIII: patient does not respond to verbal stimuli IX,X: gag reflex reduced, XI: trapezius strength unable to test bilaterally XII: tongue strength unable to test Motor: Extremities flaccid throughout.  No spontaneous movement noted.  No purposeful movements noted. Sensory: Does not respond to noxious stimuli in any extremity. Deep  Tendon Reflexes:  2+ throughout. Plantars: mute bilaterally Cerebellar: Unable to perform    Laboratory Studies:   Basic Metabolic Panel: No results found for this basename: NA, K, CL, CO2, GLUCOSE, BUN, CREATININE, CALCIUM, MG, PHOS,  in the last 168 hours  Liver Function Tests: No results found for this basename: AST, ALT, ALKPHOS, BILITOT, PROT, ALBUMIN,  in the last 168 hours No results found for this basename: LIPASE, AMYLASE,   in the last 168 hours No results found for this basename: AMMONIA,  in the last 168 hours  CBC:  Recent Labs Lab 03/06/14 1210  WBC 13.2*  NEUTROABS 11.9*  HGB 14.1  HCT 42.5  MCV 91.4  PLT 264    Cardiac Enzymes: No results found for this basename: CKTOTAL, CKMB, CKMBINDEX, TROPONINI,  in the last 168 hours  BNP: No components found with this basename: POCBNP,   CBG: No results found for this basename: GLUCAP,  in the last 168 hours  Microbiology: No results found for this or any previous visit.  Coagulation Studies: No results found for this basename: LABPROT, INR,  in the last 72 hours  Urinalysis:  Recent Labs Lab 03/06/14 [place] 1.007  PHURINE 7.5  GLUCOSEU 250*  HGBUR SMALL*  BILIRUBINUR NEGATIVE  KETONESUR NEGATIVE  PROTEINUR NEGATIVE  UROBILINOGEN 0.2  NITRITE NEGATIVE  LEUKOCYTESUR NEGATIVE    Lipid Panel:  No results found for this basename: chol, trig, hdl, cholhdl, vldl, ldlcalc    HgbA1C:  No results found for this basename: HGBA1C    Urine Drug Screen:   No results found for this basename: labopia, cocainscrnur, labbenz, amphetmu, thcu, labbarb    Alcohol Level: No results found for this basename: ETH,  in the last 168 hours  Other results: EKG: sinus rhythm at 83 bpm.  Imaging: Ct Head Wo Contrast  03/06/2014   CLINICAL DATA:  Found unresponsive.  EXAM: CT HEAD WITHOUT CONTRAST  TECHNIQUE: Contiguous axial images were obtained from the base of the skull through the vertex without contrast.  COMPARISON:  None  FINDINGS: This study is positive for intracranial hemorrhage. There is a parenchymal hemorrhage centered in the region of the left thalamus. Parenchymal hemorrhage roughly measures 2.5 x 1.4 x 3.0 cm. There is a large amount of blood within the ventricular system. The largest amount of blood is in the left ventricle but there is blood within the right ventricle, third ventricle and fourth ventricle. The  temporal horns are prominent for size. Patchy low-density throughout the periventricular white matter suggests chronic small vessel changes. Difficult to exclude old infarcts in the cerebellar hemispheres. No evidence for a midline shift.  Complete opacification of the right maxillary sinus. Wall thicken in the right maxillary sinus suggests chronic changes. A nasal tube has been placed. No acute bone abnormality.  IMPRESSION: Left parenchymal hemorrhage centered near the left thalamus. There is a large amount of intraventricular blood, particularly in the left lateral ventricle.  Temporal horns are prominent and raise concern for developing hydrocephalus.  Evidence for chronic small vessel ischemic changes.  Chronic right maxillary sinus disease.  Critical Value/emergent results were called by telephone at the time of interpretation on 03/06/2014 at 11:49 am to Dr. Shirlyn Goltz , who verbally acknowledged these results.   Electronically Signed   By: Markus Daft M.D.   On: 03/06/2014 11:58   Dg Chest Portable 1 View  03/06/2014   CLINICAL DATA:  Intubation.  EXAM: PORTABLE CHEST - 1 VIEW  FINDINGS: Endotracheal tube tip 4.6 cm above the carina. NG tube noted projected over the left chest. Although the patient has a hiatal hernia this could be in the upper portion the hiatal hernia, repositioning is suggested. Mediastinum hilar structures normal. Mild right base infiltrate cannot be excluded . No pleural effusion or pneumothorax. No acute bony abnormality identified.  IMPRESSION: 1. Endotracheal tube in good anatomic position. NG tube tip noted projected over the left chest. Although this may be in a hiatal hernia, repositioning is suggested. Critical Value/emergent results were called by telephone at the time of interpretation on 03/06/2014 at 12:43 pm to Dr. Shirlyn Goltz , who verbally acknowledged these results. 2. Infiltrate right lung base cannot be excluded.   Electronically Signed   By: Marcello Moores  Register   On:  03/06/2014 12:44    Assessment: 80 y.o. female presenting unresponsive.  Head CT reviewed and shows a left thalamic hemorrhage with extension into the ventricles.  Likely hypertensive in etiology.  Patient is now intubated.  BP initially elevated.  Not a tPA candidate secondary to hemorrhage.  Stroke Risk Factors - hypertension  Plan: 1. HgbA1c, fasting lipid panel 2. Repeat head CT in 24 hours 3. PT consult, OT consult, Speech consult 4. Echocardiogram 5. Carotid dopplers 6. Prophylactic therapy-None 7. BP control with Cardene 8. Telemetry monitoring 9. Frequent neuro checks 10. Admit to ICU.  Critical care to consult for ventilator management.    This patient is critically ill and at significant risk of neurological worsening, death and care requires constant monitoring of vital signs, hemodynamics,respiratory and cardiac monitoring, neurological assessment, discussion with family, other specialists and medical decision making of high complexity. I spent 45 minutes of neurocritical care time  in the care of  this patient.   Alexis Goodell, MD [facility] [phone] 03/06/2014, 1:08 PM

## 2014-03-06 NOTE — ED Notes (Signed)
Lactic acid 3.44 mmol/L  Abnormal labs given to Dr. Darl Householder

## 2014-03-06 NOTE — ED Notes (Addendum)
Maureen Weiss, son, [phone] went to cafeteria to get something to eat. Copies of Healthcare POA and Living Will made for chart.

## 2014-03-06 NOTE — ED Notes (Signed)
OG tube pulled per Dr. Darl Householder. Unable to verify placement at this time.

## 2014-03-06 NOTE — ED Notes (Addendum)
Pt. Arrived unresponsive.,last spoken too was 0330 am.  Pt. Live home alone.  She called him due pt. Due to her power being out.  Son came to pt.s home at 11:30 found pt. Unresponsive in the bed. Son did do chest compressions.  Paramedics reports a large amount of urine found in the bed. Large amount of drool found around her mouth.

## 2014-03-06 NOTE — Progress Notes (Addendum)
Chaplain responded to page by nurse. Chaplain spoke to pt who is unresponsive. Chaplain located son named Richardson Landry (next of kin) in the waiting area. He told me that pt lived alone since 1978 and she called him at [place] saying her power was out. Son went by later and found his mother than proceeded to call 911. Richardson Landry commented that his mother "lived her life to the fullest." Chaplain offered hospitality to pt son. Chaplain will follow as needed.   03/06/14 1600  Clinical Encounter Type  Visited With Patient;Family  Visit Type Initial  Referral From Nurse  Spiritual Encounters  Spiritual Needs Emotional  Stress Factors  Patient Stress Factors Not reviewed  Family Stress Factors Family relationships;Health changes  Humberto Addo, Barbette Hair, Chaplain 4:51 PM 03/06/2014

## 2014-03-06 NOTE — ED Notes (Signed)
Son at the bedside. Phlebotomy at the bedside.

## 2014-03-06 NOTE — Progress Notes (Signed)
ANTIBIOTIC CONSULT NOTE - INITIAL  Pharmacy Consult:  Unasyn Indication:  Aspiration PNA  Allergies  Allergen Reactions  . Neosporin [Neomycin-Polymyxin-Gramicidin]     Unknown  . Sulfa Antibiotics     Unknown    Patient Measurements: Height: 4' 10.5" (148.6 cm) Weight: 100 lb 1.4 oz (45.4 kg) IBW/kg (Calculated) : 42.05  Vital Signs: Temp: 97.2 F (36.2 C) (09/26 1400) BP: 128/63 mmHg (09/26 1400) Pulse Rate: 59 (09/26 1400)  Labs:  Recent Labs  03/06/14 1210  WBC 13.2*  HGB 14.1  PLT 264  CREATININE 0.51   Estimated Creatinine Clearance: 31.1 ml/min (by C-G formula based on Cr of 0.51). No results found for this basename: VANCOTROUGH, VANCOPEAK, VANCORANDOM, GENTTROUGH, GENTPEAK, GENTRANDOM, TOBRATROUGH, TOBRAPEAK, TOBRARND, AMIKACINPEAK, AMIKACINTROU, AMIKACIN,  in the last 72 hours   Microbiology: No results found for this or any previous visit (from the past 720 hour(s)).  Medical History: Past Medical History  Diagnosis Date  . Hypertension   . Osteoarthritis   . Colon cancer       Assessment: 29 YOF admitted after found unresponsive at home.  Head CT showed left parenchymal hemorrhage.  Pharmacy consulted to manage Unasyn for aspiration PNA.  Baseline labs reviewed.   Goal of Therapy:  Clearance of infection   Plan:  - Unasyn 1.5gm IV Q8H - Monitor renal fxn, clinical progress    Shalin Vonbargen D. Mina Marble, PharmD, BCPS Pager:  [phone] 03/06/2014, 2:42 PM

## 2014-03-06 NOTE — ED Notes (Signed)
RT at the bedside for ABG.  

## 2014-03-06 NOTE — ED Provider Notes (Signed)
CSN: [id number]     Arrival date & time 03/06/14  1127 History   First MD Initiated Contact with Patient 03/06/14 1131     Chief Complaint  Patient presents with  . Loss of Consciousness     (Consider location/radiation/quality/duration/timing/severity/associated sxs/prior Treatment) The history is provided by the EMS personnel. The history is limited by the condition of the patient.  Gabbriella Presswood is a 80 y.o. female hx of HTN, arthritis here with AMS. She talked to son around 3 am. This morning, son came to check on her and found her altered in her bed. Was noted to be hypertensive by EMS. Nasal trumpet placed by EMS.    Level V caveat- AMS   Past Medical History  Diagnosis Date  . Hypertension   . Osteoarthritis   . Colon cancer    Past Surgical History  Procedure Laterality Date  . Colon surgery     No family history on file. History  Substance Use Topics  . Smoking status: Never Smoker   . Smokeless tobacco: Not on file  . Alcohol Use: No   OB History   Grav Para Term Preterm Abortions TAB SAB Ect Mult Living                 Review of Systems  Unable to perform ROS: Mental status change  Cardiovascular: Positive for syncope.      Allergies  Neosporin and Sulfa antibiotics  Home Medications   Prior to Admission medications   Medication Sig Start Date End Date Taking? Authorizing Provider  atenolol (TENORMIN) 100 MG tablet Take 100 mg by mouth daily.   Yes Historical Provider, MD  DULoxetine (CYMBALTA) 30 MG capsule Take 30 mg by mouth daily. 02/17/14  Yes Historical Provider, MD  HYDROcodone-acetaminophen (NORCO/VICODIN) 5-325 MG per tablet Take 1 tablet by mouth every 6 (six) hours as needed. 10/15/13  Yes Gregor Hams, MD  Multiple Vitamin (MULITIVITAMIN WITH MINERALS) TABS Take 1 tablet by mouth daily.   Yes Historical Provider, MD   BP 203/104  Pulse 84  Temp(Src) 97.7 F (36.5 C)  Resp 14  Ht 4' 10.5" (1.486 m)  SpO2 99% Physical Exam  Nursing  note and vitals reviewed. Constitutional:  Altered, lethargic, drooling.   HENT:  Head: Normocephalic.  Drooling, foaming at the mouth   Eyes: Conjunctivae are normal. Pupils are equal, round, and reactive to light.  Neck: Normal range of motion. Neck supple.  Cardiovascular: Normal rate, regular rhythm and normal heart sounds.   Pulmonary/Chest:  Dec breath sounds bilaterally   Abdominal: Soft. Bowel sounds are normal. She exhibits no distension. There is no tenderness. There is no rebound.  Musculoskeletal: Normal range of motion. She exhibits no edema and no tenderness.  Neurological:  Lethargic, increased tone R side, dec tone L side   Skin: Skin is warm and dry.  Psychiatric:  Altered     ED Course  Procedures (including critical care time)  CRITICAL CARE Performed by: Darl Householder, Leviticus Harton   Total critical care time: 30 min   Critical care time was exclusive of separately billable procedures and treating other patients.  Critical care was necessary to treat or prevent imminent or life-threatening deterioration.  Critical care was time spent personally by me on the following activities: development of treatment plan with patient and/or surrogate as well as nursing, discussions with consultants, evaluation of patient's response to treatment, examination of patient, obtaining history from patient or surrogate, ordering and performing treatments and interventions, ordering  and review of laboratory studies, ordering and review of radiographic studies, pulse oximetry and re-evaluation of patient's condition.  INTUBATION Performed by: Darl Householder, Jilian West  Required items: required blood products, implants, devices, and special equipment available Patient identity confirmed: provided demographic data and hospital-assigned identification number Time out: Immediately prior to procedure a "time out" was called to verify the correct patient, procedure, equipment, support staff and site/side marked as  required.  Indications: head bleed, secretions, air protection  Intubation method: Glidescope Laryngoscopy   Preoxygenation: BVM  Sedatives: 20 mg Etomidate Paralytic: 80 mg roccuronium  Tube Size: 7.0  cuffed  Post-procedure assessment: chest rise and ETCO2 monitor Breath sounds: equal and absent over the epigastrium Tube secured with: ETT holder Chest x-ray interpreted by radiologist and me.  Chest x-ray findings: endotracheal tube in appropriate position  Patient tolerated the procedure well with no immediate complications.    Labs Review Labs Reviewed  CBC WITH DIFFERENTIAL  COMPREHENSIVE METABOLIC PANEL  URINALYSIS, ROUTINE W REFLEX MICROSCOPIC  CK  I-STAT TROPOININ, ED  I-STAT CG4 LACTIC ACID, ED    Imaging Review Ct Head Wo Contrast  03/06/2014   CLINICAL DATA:  Found unresponsive.  EXAM: CT HEAD WITHOUT CONTRAST  TECHNIQUE: Contiguous axial images were obtained from the base of the skull through the vertex without contrast.  COMPARISON:  None  FINDINGS: This study is positive for intracranial hemorrhage. There is a parenchymal hemorrhage centered in the region of the left thalamus. Parenchymal hemorrhage roughly measures 2.5 x 1.4 x 3.0 cm. There is a large amount of blood within the ventricular system. The largest amount of blood is in the left ventricle but there is blood within the right ventricle, third ventricle and fourth ventricle. The temporal horns are prominent for size. Patchy low-density throughout the periventricular white matter suggests chronic small vessel changes. Difficult to exclude old infarcts in the cerebellar hemispheres. No evidence for a midline shift.  Complete opacification of the right maxillary sinus. Wall thicken in the right maxillary sinus suggests chronic changes. A nasal tube has been placed. No acute bone abnormality.  IMPRESSION: Left parenchymal hemorrhage centered near the left thalamus. There is a large amount of intraventricular  blood, particularly in the left lateral ventricle.  Temporal horns are prominent and raise concern for developing hydrocephalus.  Evidence for chronic small vessel ischemic changes.  Chronic right maxillary sinus disease.  Critical Value/emergent results were called by telephone at the time of interpretation on 03/06/2014 at 11:49 am to Dr. Shirlyn Goltz , who verbally acknowledged these results.   Electronically Signed   By: Markus Daft M.D.   On: 03/06/2014 11:58     EKG Interpretation   Date/Time:  Saturday March 06 2014 11:53:57 EDT Ventricular Rate:  83 PR Interval:  169 QRS Duration: 102 QT Interval:  415 QTC Calculation: 488 R Axis:   -11 Text Interpretation:  Sinus rhythm Abnormal R-wave progression, early  transition Probable anteroseptal infarct, old Minimal ST depression,  anterolateral leads No significant change since last tracing Confirmed by  Arsal Tappan  MD, Deyvi Bonanno ([id number]) on 03/06/2014 12:15:51 PM      MDM   Final diagnoses:  None   Karsten Vaughn is a 80 y.o. female here with AMS. I intubated patient since she is drooling and possibly aspirated. I was concerned for head bleed given hypertension. CT head confirmed intracranial hemorrhage. I started her on cardene drip. Neuro consulted and will admit to neuro ICU. Patient full code as per EMS.  Wandra Arthurs, MD 03/06/14 [phone]

## 2014-03-06 NOTE — Consult Note (Addendum)
PULMONARY / CRITICAL CARE MEDICINE   Name: Maureen Weiss MRN: [id number] DOB: Feb 06, 1921    ADMISSION DATE:  03/06/2014 CONSULTATION DATE:  9/26   REFERRING MD :  Doy Mince   CHIEF COMPLAINT:  ICH and respiratory failure  INITIAL PRESENTATION:  80 year old female admitted on 9/26 w/ large L ICH. Intubated as not able to protect airway. PCCM asked to see for a/w vent and medical management.   STUDIES:  CT head 9/26: left thalamic hemorrhage with extension into the ventricles   SIGNIFICANT EVENTS: 9/26: confirmed DNR w/ pt's son    HISTORY OF PRESENT ILLNESS:   Maureen Weiss is an 80 y.o. female who lives alone and is able to perform all of her ADL's at [facility]. Was fine when she talked to her son at 58 on 9/26 to tell him that the power was out. When he called to check on her later in the day she did not answer. When he went to the house she was in the bed and unresponsive. EMS was called at that time and he attempted to start chest compressions prior to their arrival. Patient was brought in for evaluation and was not able to protect her airway. She required intubation at that time. CT head showed left thalamic hemorrhage with extension into the ventricles. Was admitted by neuro. PCCM asked to assist w/ vent and medical management    PAST MEDICAL HISTORY :   has a past medical history of Hypertension; Osteoarthritis; and Colon cancer.  has past surgical history that includes Colon surgery. Prior to Admission medications   Medication Sig Start Date End Date Taking? Authorizing Provider  atenolol (TENORMIN) 100 MG tablet Take 100 mg by mouth daily.   Yes Historical Provider, MD  DULoxetine (CYMBALTA) 30 MG capsule Take 30 mg by mouth daily. 02/17/14  Yes Historical Provider, MD  HYDROcodone-acetaminophen (NORCO/VICODIN) 5-325 MG per tablet Take 1 tablet by mouth every 6 (six) hours as needed. 10/15/13  Yes Gregor Hams, MD  Multiple Vitamin (MULITIVITAMIN WITH MINERALS) TABS Take 1 tablet by  mouth daily.   Yes Historical Provider, MD   Allergies  Allergen Reactions  . Neosporin [Neomycin-Polymyxin-Gramicidin]     Unknown  . Sulfa Antibiotics     Unknown    FAMILY HISTORY:  has no family status information on file.  SOCIAL HISTORY:  reports that she has never smoked. She does not have any smokeless tobacco history on file. She reports that she does not drink alcohol or use illicit drugs.  REVIEW OF SYSTEMS:  Unable   SUBJECTIVE:  Unresponsive on vent  VITAL SIGNS: Temp:  [97 F (36.1 C)-97.7 F (36.5 C)] 97.2 F (36.2 C) (09/26 1400) Pulse Rate:  [59-93] 59 (09/26 1400) Resp:  [14-19] 19 (09/26 1400) BP: (128-203)/(62-104) 128/63 mmHg (09/26 1400) SpO2:  [96 %-100 %] 100 % (09/26 1400) FiO2 (%):  [40 %-60 %] 40 % (09/26 1218) Weight:  [45.4 kg (100 lb 1.4 oz)] 45.4 kg (100 lb 1.4 oz) (09/26 1400) HEMODYNAMICS:   VENTILATOR SETTINGS: Vent Mode:  [-] PRVC FiO2 (%):  [40 %-60 %] 40 % Set Rate:  [14 bmp] 14 bmp Vt Set:  [400 mL] 400 mL PEEP:  [5 cmH20] 5 cmH20 Plateau Pressure:  [13 cmH20] 13 cmH20 INTAKE / OUTPUT: No intake or output data in the 24 hours ending 03/06/14 1441  PHYSICAL EXAMINATION: General:  Unresponsive. No movement on right  Neuro:  GCS 4 HEENT:  Poor dentition orally intubated. PERRL  Cardiovascular:  rrr Lungs:  Scattered rhonchi  Abdomen:  Soft, + bowel sounds  Musculoskeletal:  Intact  Skin:  Intact   LABS:  CBC  Recent Labs Lab 03/06/14 1210  WBC 13.2*  HGB 14.1  HCT 42.5  PLT 264   Coag's No results found for this basename: APTT, INR,  in the last 168 hours BMET  Recent Labs Lab 03/06/14 1210  NA 138  K 3.7  CL 97  CO2 25  BUN 10  CREATININE 0.51  GLUCOSE 158*   Electrolytes  Recent Labs Lab 03/06/14 1210  CALCIUM 9.4   Sepsis Markers  Recent Labs Lab 03/06/14 1230  LATICACIDVEN 3.44*   ABG  Recent Labs Lab 03/06/14 1223  PHART 7.460*  PCO2ART 36.2  PO2ART 125.0*   Liver  Enzymes  Recent Labs Lab 03/06/14 1210  AST 30  ALT 19  ALKPHOS 67  BILITOT 0.4  ALBUMIN 3.9   Cardiac Enzymes No results found for this basename: TROPONINI, PROBNP,  in the last 168 hours Glucose No results found for this basename: GLUCAP,  in the last 168 hours  Imaging No results found.   ASSESSMENT / PLAN:  PULMONARY OETT 9/26 A: acute resp failure      Possible aspiration pna  P:   Full vent support F/u abg and cxr  Empiric abx see ID section  See neuro section   CARDIOVASCULAR CVL A:  htn  P:  cardene for goal bp 130-150   RENAL A:   No acute  P:   Avoid hypotension Renal dose meds  F/u chem in am   GASTROINTESTINAL A:   No acute  P:   SUP w/ PPI   HEMATOLOGIC A:  No acute  P:  PAS due to [place] Trend CBC   INFECTIOUS A:  prob aspiration  P:   Sputum 9/26>>> Abx: unasyn  start date 9/26, day 0/x>>>  ENDOCRINE A:   Hyperglycemia  P:   Trend glucose  Start ssi if glucose > 150 X 2 on accu checks   NEUROLOGIC A:   Acute encephalopathy/ COMA in setting of ICH , GCS 3 P:   RASS goal: 0 Supportive care  Carotid dopplers  BP control w/ cardene   Family updated by NP: updated son at bedside. Son is POA. She is DNR     Interdisciplinary Family Meeting v Palliative Care Meeting:    TODAY'S SUMMARY: this is a 80 year old female who has had a devastating ICH. She is unresponsive and on vent. Prognosis is very poor. Family wants to cont supportive care but does not want to prolong things if hope for meaningful recovery is poor. Made her DNR. Suspect that we will eventually be discussing w/d of care which the son is prepared to do if no improvement neurologically in the next 24-48 hrs.    Marni Griffon ACNP Pulmonary and [place] Pager: [phone]  03/06/2014, 2:41 PM   STAFF NOTE: I, Dr Ann Lions have personally reviewed patient's available data, including medical history, events of note,  physical examination and test results as part of my evaluation. I have discussed with resident/NP and other care providers such as pharmacist, RN and RRT.  In addition,  I personally evaluated patient and elicited key findings of massive intracranial hemorrhage with GCS 3 but does have pupils and some withdrawal.  Not survivable. Could pass away overnight. \.  Rest per NP/medical resident whose note is outlined above and that I agree with  The patient is critically ill with multiple organ systems failure and requires high complexity decision making for assessment and support, frequent evaluation and titration of therapies, application of advanced monitoring technologies and extensive interpretation of multiple databases.   Critical Care Time devoted to patient care services described in this note is  30  Minutes.  Dr. Brand Males, M.D., [place].C.P Pulmonary and Critical Care Medicine Staff Physician [facility] [facility] Pager: [phone], If no answer or between  15:00h - 7:00h: call [phone]  03/06/2014 4:57 PM

## 2014-03-07 ENCOUNTER — Inpatient Hospital Stay (HOSPITAL_COMMUNITY): Payer: Medicare Other

## 2014-03-07 DIAGNOSIS — I10 Essential (primary) hypertension: Secondary | ICD-10-CM

## 2014-03-07 DIAGNOSIS — J9600 Acute respiratory failure, unspecified whether with hypoxia or hypercapnia: Secondary | ICD-10-CM

## 2014-03-07 DIAGNOSIS — R4020 Unspecified coma: Secondary | ICD-10-CM

## 2014-03-07 DIAGNOSIS — I629 Nontraumatic intracranial hemorrhage, unspecified: Secondary | ICD-10-CM

## 2014-03-07 DIAGNOSIS — I359 Nonrheumatic aortic valve disorder, unspecified: Secondary | ICD-10-CM

## 2014-03-07 LAB — GLUCOSE, CAPILLARY: Glucose-Capillary: 158 mg/dL — ABNORMAL HIGH (ref 70–99)

## 2014-03-07 MED ORDER — VITAL HIGH PROTEIN PO LIQD
1000.0000 mL | ORAL | Status: DC
  Filled 2014-03-07 (×4): qty 1000

## 2014-03-07 NOTE — Progress Notes (Signed)
SLP Cancellation Note  Patient Details Name: Frieda Arnall MRN: [id number] DOB: [birth year]   Cancelled treatment:       Reason Eval/Treat Not Completed: Medical issues which prohibited therapy; pt intubated and remains on vent.  Will sign off. Please reorder post extubation.    Juan Quam Laurice 03/07/2014, 8:47 AM

## 2014-03-07 NOTE — Progress Notes (Signed)
*  PRELIMINARY RESULTS* Vascular Ultrasound Carotid Duplex (Doppler) has been completed. Findings suggest 1-39% internal carotid artery stenosis bilaterally. Vertebral arteries are patent with antegrade flow.  03/07/2014 3:18 PM Maudry Mayhew, RVT, RDCS, RDMS

## 2014-03-07 NOTE — Progress Notes (Signed)
Brief Nutrition Note  Consult received for enteral/tube feeding initiation and management. RD notes pt with devastating ICH.  Current plan to continue supportive care.   Adult Enteral Nutrition Protocol initiated. Full assessment to follow.  Admitting Dx: Intracranial hemorrhage [432.9]  Body mass index is 17.91 kg/(m^2). Pt meets criteria for underweight based on current BMI.  Labs:   Recent Labs Lab 03/06/14 1210  NA 138  K 3.7  CL 97  CO2 25  BUN 10  CREATININE 0.51  CALCIUM 9.4  GLUCOSE 158*    Brynda Greathouse, MS RD LDN Clinical Inpatient Dietitian Weekend/After hours pager: [phone]

## 2014-03-07 NOTE — Progress Notes (Signed)
  Echocardiogram 2D Echocardiogram has been performed.  Darlina Sicilian M 03/07/2014, 1:53 PM

## 2014-03-07 NOTE — Progress Notes (Signed)
PT Cancellation Note  Patient Details Name: Maureen Weiss MRN: [id number] DOB: [birth year]   Cancelled Treatment:    Reason Eval/Treat Not Completed: Medical issues which prohibited therapy;Patient not medically ready. Pt on vent and on bedrest. Will attempt to see pt when medically appropriate.    Elie Confer Earl, [place] 03/07/2014, 6:51 AM

## 2014-03-07 NOTE — Progress Notes (Signed)
PULMONARY / CRITICAL CARE MEDICINE   Name: Maureen Weiss MRN: [id number] DOB: Aug 24, 1920    ADMISSION DATE:  03/06/2014 CONSULTATION DATE:  9/26   REFERRING MD :  Doy Mince   CHIEF COMPLAINT:  ICH and respiratory failure  INITIAL PRESENTATION:  80 year old female admitted on 9/26 w/ large L ICH. Intubated as not able to protect airway. PCCM asked to see for a/w vent and medical management.   STUDIES:  CT head 9/26: left thalamic hemorrhage with extension into the ventricles  SIGNIFICANT EVENTS: 9/26: confirmed DNR w/ pt's son   SUBJECTIVE:  Unresponsive on vent   VITAL SIGNS: Temp:  [97 F (36.1 C)-99.9 F (37.7 C)] 99.9 F (37.7 C) (09/27 1000) Pulse Rate:  [59-93] 90 (09/27 1000) Resp:  [13-20] 17 (09/27 1000) BP: (119-203)/(50-104) 166/87 mmHg (09/27 1000) SpO2:  [96 %-100 %] 99 % (09/27 1000) FiO2 (%):  [40 %-60 %] 40 % (09/27 0746) Weight:  [41.6 kg (91 lb 11.4 oz)-45.4 kg (100 lb 1.4 oz)] 41.6 kg (91 lb 11.4 oz) (09/26 1600) HEMODYNAMICS:   VENTILATOR SETTINGS: Vent Mode:  [-] PRVC FiO2 (%):  [40 %-60 %] 40 % Set Rate:  [14 bmp] 14 bmp Vt Set:  [400 mL] 400 mL PEEP:  [5 cmH20] 5 cmH20 Plateau Pressure:  [13 cmH20] 13 cmH20 INTAKE / OUTPUT:  Intake/Output Summary (Last 24 hours) at 03/07/14 1055 Last data filed at 03/07/14 0800  Gross per 24 hour  Intake 1223.75 ml  Output   1540 ml  Net -316.25 ml   PHYSICAL EXAMINATION: General:  Unresponsive. Does not withdraw to pain Neuro:  GCS 4 HEENT:  Poor dentition orally intubated. PERRL  Cardiovascular:  rrr Lungs:  Scattered rhonchi  Abdomen:  Soft, + bowel sounds  Musculoskeletal:  Intact  Skin:  Intact   LABS:  CBC  Recent Labs Lab 03/06/14 1210  WBC 13.2*  HGB 14.1  HCT 42.5  PLT 264   Coag's No results found for this basename: APTT, INR,  in the last 168 hours BMET  Recent Labs Lab 03/06/14 1210  NA 138  K 3.7  CL 97  CO2 25  BUN 10  CREATININE 0.51  GLUCOSE 158*    Electrolytes  Recent Labs Lab 03/06/14 1210  CALCIUM 9.4   Sepsis Markers  Recent Labs Lab 03/06/14 1230  LATICACIDVEN 3.44*   ABG  Recent Labs Lab 03/06/14 1223  PHART 7.460*  PCO2ART 36.2  PO2ART 125.0*   Liver Enzymes  Recent Labs Lab 03/06/14 1210  AST 30  ALT 19  ALKPHOS 67  BILITOT 0.4  ALBUMIN 3.9   Cardiac Enzymes No results found for this basename: TROPONINI, PROBNP,  in the last 168 hours Glucose No results found for this basename: GLUCAP,  in the last 168 hours  Imaging Ct Head Wo Contrast  03/06/2014   CLINICAL DATA:  Found unresponsive.  EXAM: CT HEAD WITHOUT CONTRAST  TECHNIQUE: Contiguous axial images were obtained from the base of the skull through the vertex without contrast.  COMPARISON:  None  FINDINGS: This study is positive for intracranial hemorrhage. There is a parenchymal hemorrhage centered in the region of the left thalamus. Parenchymal hemorrhage roughly measures 2.5 x 1.4 x 3.0 cm. There is a large amount of blood within the ventricular system. The largest amount of blood is in the left ventricle but there is blood within the right ventricle, third ventricle and fourth ventricle. The temporal horns are prominent for size. Patchy low-density throughout the periventricular  white matter suggests chronic small vessel changes. Difficult to exclude old infarcts in the cerebellar hemispheres. No evidence for a midline shift.  Complete opacification of the right maxillary sinus. Wall thicken in the right maxillary sinus suggests chronic changes. A nasal tube has been placed. No acute bone abnormality.  IMPRESSION: Left parenchymal hemorrhage centered near the left thalamus. There is a large amount of intraventricular blood, particularly in the left lateral ventricle.  Temporal horns are prominent and raise concern for developing hydrocephalus.  Evidence for chronic small vessel ischemic changes.  Chronic right maxillary sinus disease.  Critical  Value/emergent results were called by telephone at the time of interpretation on 03/06/2014 at 11:49 am to Dr. Shirlyn Goltz , who verbally acknowledged these results.   Electronically Signed   By: Markus Daft M.D.   On: 03/06/2014 11:58   Dg Chest Portable 1 View  03/06/2014   CLINICAL DATA:  Loss of consciousness.  EXAM: PORTABLE CHEST - 1 VIEW  COMPARISON:  03/06/2014  FINDINGS: Endotracheal tube is in place with tip approximately 0.7 cm above carina. Nasogastric tube is in place, with atypical course towards the left lung base. This raises a question placement within the left mainstem bronchus or a a large hiatal hernia. There is dense opacity at the left lung base. No pneumothorax. Multiple skin folds overlie the upper chest bilaterally.  IMPRESSION: 1. Atypical course of the nasogastric tube raising the question of 2 within the left mainstem bronchus and left lower lobe of the lung versus hiatal hernia. 2. Critical Value/emergent results were called by telephone at the time of interpretation on 03/06/2014 at 2:20 pm to Dr. Shirlyn Goltz , who verbally acknowledged these results.   Electronically Signed   By: Shon Hale M.D.   On: 03/06/2014 14:20   Dg Chest Portable 1 View  03/06/2014   CLINICAL DATA:  Intubation.  EXAM: PORTABLE CHEST - 1 VIEW  FINDINGS: Endotracheal tube tip 4.6 cm above the carina. NG tube noted projected over the left chest. Although the patient has a hiatal hernia this could be in the upper portion the hiatal hernia, repositioning is suggested. Mediastinum hilar structures normal. Mild right base infiltrate cannot be excluded . No pleural effusion or pneumothorax. No acute bony abnormality identified.  IMPRESSION: 1. Endotracheal tube in good anatomic position. NG tube tip noted projected over the left chest. Although this may be in a hiatal hernia, repositioning is suggested. Critical Value/emergent results were called by telephone at the time of interpretation on 03/06/2014 at 12:43 pm to  Dr. Shirlyn Goltz , who verbally acknowledged these results. 2. Infiltrate right lung base cannot be excluded.   Electronically Signed   By: Marcello Moores  Register   On: 03/06/2014 12:44   ASSESSMENT / PLAN:  PULMONARY OETT 9/26 A: acute resp failure      Possible aspiration pna  P:   Begin PS trials but no extubation given mental status Empiric abx see ID section  See neuro section   CARDIOVASCULAR CVL A:  htn  P:  Cardene for goal bp 130-150   RENAL A:   No acute  P:   Avoid hypotension Renal dose meds  F/u chem in am   GASTROINTESTINAL A:   No acute  P:   SUP w/ PPI   HEMATOLOGIC A:  No acute  P:  PAS due to [place] Trend CBC   INFECTIOUS A:  prob aspiration  P:   Sputum 9/26>>> Abx: unasyn  start date 9/26, day  1/x>>>  ENDOCRINE A:   Hyperglycemia  P:   Trend glucose  SSI if glucose > 150 X 2 on accu checks   NEUROLOGIC A:   Acute encephalopathy/ COMA in setting of ICH , GCS 3 P:   RASS goal: 0 Supportive care  Carotid dopplers  BP control w/ cardene   TODAY'S SUMMARY: 80 year old female who has had a devastating ICH. She is unresponsive and on vent. Prognosis is very poor. Family wants to cont supportive care but does not want to prolong things if hope for meaningful recovery is poor.   The patient is critically ill with multiple organ systems failure and requires high complexity decision making for assessment and support, frequent evaluation and titration of therapies, application of advanced monitoring technologies and extensive interpretation of multiple databases.   Critical Care Time devoted to patient care services described in this note is 35 Minutes.  Rush Farmer, M.D. [facility] Pulmonary/Critical Care Medicine. Pager: [phone]. After hours pager: [phone].  03/07/2014 10:55 AM

## 2014-03-07 NOTE — Progress Notes (Addendum)
STROKE TEAM PROGRESS NOTE   HISTORY Maureen Weiss is an 80 y.o. female who lives alone and is able to perform all of her ADL's. Was fine when she talked to her son at 72 to tell him that the power was out. When he called to check on her later in the day she did not answer. When he went to the house she was in the bed and unresponsive. EMS was called at that time and he attempted to start chest compressions prior to their arrival. Patient was brought in for evaluation and was not able to protect her airway. She required intubation at that time.  It is unclear if the patient was taking her antihypertensives at home.    Date last known well: Date: 03/06/2014  Time last known well: Time: 03:30  tPA Given: No: ICH   SUBJECTIVE (INTERVAL HISTORY) Intubated w no sedation. Son at bedside.    OBJECTIVE Temp:  [97 F (36.1 C)-99.5 F (37.5 C)] 99.1 F (37.3 C) (09/27 0715) Pulse Rate:  [59-93] 89 (09/27 0746) Cardiac Rhythm:  [-] Normal sinus rhythm (09/27 0400) Resp:  [13-20] 20 (09/27 0746) BP: (119-203)/(50-104) 153/64 mmHg (09/27 0715) SpO2:  [96 %-100 %] 100 % (09/27 0746) FiO2 (%):  [40 %-60 %] 40 % (09/27 0746) Weight:  [91 lb 11.4 oz (41.6 kg)-100 lb 1.4 oz (45.4 kg)] 91 lb 11.4 oz (41.6 kg) (09/26 1600)  No results found for this basename: GLUCAP,  in the last 168 hours  Recent Labs Lab 03/06/14 1210  NA 138  K 3.7  CL 97  CO2 25  GLUCOSE 158*  BUN 10  CREATININE 0.51  CALCIUM 9.4    Recent Labs Lab 03/06/14 1210  AST 30  ALT 19  ALKPHOS 67  BILITOT 0.4  PROT 7.5  ALBUMIN 3.9    Recent Labs Lab 03/06/14 1210  WBC 13.2*  NEUTROABS 11.9*  HGB 14.1  HCT 42.5  MCV 91.4  PLT 264    Recent Labs Lab 03/06/14 1210  CKTOTAL 98   No results found for this basename: LABPROT, INR,  in the last 72 hours  Recent Labs  03/06/14 1217  COLORURINE YELLOW  LABSPEC 1.007  PHURINE 7.5  GLUCOSEU 250*  HGBUR SMALL*  BILIRUBINUR NEGATIVE  KETONESUR NEGATIVE   PROTEINUR NEGATIVE  UROBILINOGEN 0.2  NITRITE NEGATIVE  LEUKOCYTESUR NEGATIVE    No results found for this basename: chol, trig, hdl, cholhdl, vldl, ldlcalc   No results found for this basename: HGBA1C   No results found for this basename: labopia, cocainscrnur, labbenz, amphetmu, thcu, labbarb    No results found for this basename: ETH,  in the last 168 hours  Ct Head Wo Contrast 03/06/2014    Left parenchymal hemorrhage centered near the left thalamus. There is a large amount of intraventricular blood, particularly in the left lateral ventricle.  Temporal horns are prominent and raise concern for developing hydrocephalus.  Evidence for chronic small vessel ischemic changes.  Chronic right maxillary sinus disease.    Dg Chest Portable 1 View 03/06/2014    1. Atypical course of the nasogastric tube raising the question of 2 within the left mainstem bronchus and left lower lobe of the lung versus hiatal hernia.    Dg Chest Portable 1 View 03/06/2014    1. Endotracheal tube in good anatomic position. NG tube tip noted projected over the left chest. Although this may be in a hiatal hernia, repositioning is suggested.  2. Infiltrate right lung base cannot  be excluded.        PHYSICAL EXAM  GENERAL: No acute distress.   HEENT: Intubated  ABDOMEN: soft  EXTREMITIES: No edema   BACK: normal.   SKIN: Normal by inspection.    MENTAL STATUS: No eye opening even to pain.   CRANIAL NERVES: Pupils are equal, round and reactive to light; extra ocular movements are full- oculocephalic reflex, there is no significant nystagmus; upper and lower facial muscles are normal in strength and symmetric, there is no flattening of the nasolabial folds; Corneal and gag intact. - both MOTOR: Localizes to pain both sides. Antigravity strength .    COORDINATION: Left finger to nose is normal, right finger to nose is normal, No rest tremor; no intention tremor; no postural tremor; no  bradykinesia.   SENSATION: Response to pain both sides.    HEAD CT reviewed in person moderate size L parenchymal bleed w extensive vent blood w/o hydro  ASSESSMENT/PLAN  [name] is a 80 y.o. female with history of hypertension and colon cancer presenting with unresponsiveness. She did not receive IV t-PA secondary to intercranial hemorrhage. Ct Head Wo Contrast - left parenchymal hemorrhage centered near the left thalamus. There is a large amount of intraventricular blood, particularly in the left lateral ventricle.  Temporal horns are prominent and raise concern for developing hydrocephalus.   Stroke -  left parenchymal hemorrhage centered near the left thalamus.  MRI - not ordered  MRA - not ordered  Carotid Doppler - pending  2D Echo - pending                                                                        no antithrombotics prior to admission, now on no antithrombotics secondary to hemorrhage.  LDL - not ordered  HgbA1c - not ordered   SCDs for VTE prophylaxis  NPO no liquids.   Bedrest  Resultant stupor.   Therapy recommendations:  Pending  Ongoing aggressive risk factor management  Risk factor education  Disposition:  Pending  Hypertension   BP goal long term normotensive  BP goal - < 160/90 BP (119-203)/(50-104) 153/64 mmHg (09/27 0715)past 24h (03/07/2014 @ 8:04 AM)  Unstable - Cardene drip  Patient will be counseled to be compliant with her blood pressure medications   Other Stroke Risk Factors Advanced age   Other Active Problems  Mild leukocytosis  Aspiration pneumonia - Unasyn  Intubated - CCM managing ventilator  Other Pertinent History  DNR  -- Dr Merlene Laughter- Length discussion w son. Has living will. Leading to non aggressive care but wants to talk to other family. Will hold repeat head for now.   Hospital day # 1  Mikey Bussing PA-C [facility] Pager [phone] 03/07/2014, 8:05 AM  The  patient was seen and examined by me; notes, chart and tests reviewed and discussed with midlevel provider, other providers, patient, and family.  Critical patient risk of multiple complication. 30 min  Critical care time.     To contact Stroke Continuity provider, please refer to [email or web address] After hours, contact General Neurology

## 2014-03-08 ENCOUNTER — Inpatient Hospital Stay (HOSPITAL_COMMUNITY): Payer: Medicare Other

## 2014-03-08 DIAGNOSIS — E43 Unspecified severe protein-calorie malnutrition: Secondary | ICD-10-CM | POA: Insufficient documentation

## 2014-03-08 LAB — GLUCOSE, CAPILLARY
GLUCOSE-CAPILLARY: 157 mg/dL — AB (ref 70–99)
GLUCOSE-CAPILLARY: 134 mg/dL — AB (ref 70–99)
Glucose-Capillary: 124 mg/dL — ABNORMAL HIGH (ref 70–99)
Glucose-Capillary: 134 mg/dL — ABNORMAL HIGH (ref 70–99)
Glucose-Capillary: 137 mg/dL — ABNORMAL HIGH (ref 70–99)
Glucose-Capillary: 172 mg/dL — ABNORMAL HIGH (ref 70–99)

## 2014-03-08 LAB — BLOOD GAS, ARTERIAL
Acid-Base Excess: 0.4 mmol/L (ref 0.0–2.0)
BICARBONATE: 23.3 meq/L (ref 20.0–24.0)
Drawn by: 31101
FIO2: 0.4 %
MECHVT: 400 mL
O2 SAT: 99.1 %
PEEP: 5 cmH2O
Patient temperature: 98.6
RATE: 14 resp/min
TCO2: 24.3 mmol/L (ref 0–100)
pCO2 arterial: 30.3 mmHg — ABNORMAL LOW (ref 35.0–45.0)
pH, Arterial: 7.499 — ABNORMAL HIGH (ref 7.350–7.450)
pO2, Arterial: 174 mmHg — ABNORMAL HIGH (ref 80.0–100.0)

## 2014-03-08 LAB — CBC
HEMATOCRIT: 41.2 % (ref 36.0–46.0)
HEMOGLOBIN: 13.5 g/dL (ref 12.0–15.0)
MCH: 30.3 pg (ref 26.0–34.0)
MCHC: 32.8 g/dL (ref 30.0–36.0)
MCV: 92.6 fL (ref 78.0–100.0)
Platelets: 282 10*3/uL (ref 150–400)
RBC: 4.45 MIL/uL (ref 3.87–5.11)
RDW: 14 % (ref 11.5–15.5)
WBC: 16.7 10*3/uL — ABNORMAL HIGH (ref 4.0–10.5)

## 2014-03-08 LAB — BASIC METABOLIC PANEL
Anion gap: 14 (ref 5–15)
BUN: 13 mg/dL (ref 6–23)
CALCIUM: 9.1 mg/dL (ref 8.4–10.5)
CO2: 21 meq/L (ref 19–32)
Chloride: 102 mEq/L (ref 96–112)
Creatinine, Ser: 0.42 mg/dL — ABNORMAL LOW (ref 0.50–1.10)
GFR calc Af Amer: >90 mL/min (ref >90)
GFR calc non Af Amer: 88 mL/min — ABNORMAL LOW (ref >90)
GLUCOSE: 159 mg/dL — AB (ref 70–99)
Potassium: 3.3 mEq/L — ABNORMAL LOW (ref 3.7–5.3)
Sodium: 137 mEq/L (ref 137–147)

## 2014-03-08 LAB — MAGNESIUM: MAGNESIUM: 1.9 mg/dL (ref 1.5–2.5)

## 2014-03-08 LAB — PHOSPHORUS: Phosphorus: 1.9 mg/dL — ABNORMAL LOW (ref 2.3–4.6)

## 2014-03-08 MED ORDER — POTASSIUM PHOSPHATES 15 MMOLE/5ML IV SOLN
15.0000 mmol | Freq: Once | INTRAVENOUS | Status: AC
  Administered 2014-03-08: 15 mmol via INTRAVENOUS
  Filled 2014-03-08: qty 5

## 2014-03-08 NOTE — Progress Notes (Addendum)
PULMONARY / CRITICAL CARE MEDICINE   Name: Maureen Weiss MRN: [id number] DOB: [birth year]    ADMISSION DATE:  03/06/2014 CONSULTATION DATE:  9/26   REFERRING MD :  Doy Mince   CHIEF COMPLAINT:  ICH and respiratory failure  INITIAL PRESENTATION:  80 year old female admitted on 9/26 w/ large L ICH. Intubated as not able to protect airway. PCCM asked to see for a/w vent and medical management.   STUDIES:  CT head 9/26: left thalamic hemorrhage with extension into the ventricles  SIGNIFICANT EVENTS: 9/26: confirmed DNR w/ pt's son   SUBJECTIVE:  03/08/14: Unresponsive on vent - gcs 4. Gag +. Moves left upper arm.   VITAL SIGNS: Temp:  [95.5 F (35.3 C)-99.9 F (37.7 C)] 99.1 F (37.3 C) (09/28 0900) Pulse Rate:  [81-102] 87 (09/28 0900) Resp:  [14-24] 14 (09/28 0830) BP: (122-186)/(50-95) 155/63 mmHg (09/28 0900) SpO2:  [97 %-100 %] 100 % (09/28 0900) FiO2 (%):  [30 %-40 %] 40 % (09/28 0900) Weight:  [42 kg (92 lb 9.5 oz)] 42 kg (92 lb 9.5 oz) (09/28 0500) HEMODYNAMICS:   VENTILATOR SETTINGS: Vent Mode:  [-] PRVC FiO2 (%):  [30 %-40 %] 40 % Set Rate:  [14 bmp] 14 bmp Vt Set:  [400 mL] 400 mL PEEP:  [5 cmH20] 5 cmH20 Plateau Pressure:  [12 cmH20-14 cmH20] 14 cmH20 INTAKE / OUTPUT:  Intake/Output Summary (Last 24 hours) at 03/08/14 0909 Last data filed at 03/08/14 0900  Gross per 24 hour  Intake 2755.33 ml  Output    860 ml  Net 1895.33 ml   PHYSICAL EXAMINATION: General:  Unresponsive.  Neuro:  03/08/14: Unresponsive on vent - gcs 4. Gag +. Moves left upper arm.  Can withdraw all limbs except RUE to pain HEENT:  Poor dentition orally intubated. PERRL  Cardiovascular:  rrr Lungs:  CT A bilaterally Abdomen:  Soft, + bowel sounds  Musculoskeletal:  Intact  Skin:  Intact   LABS:  CBC  Recent Labs Lab 03/06/14 1210 03/08/14 0245  WBC 13.2* 16.7*  HGB 14.1 13.5  HCT 42.5 41.2  PLT 264 282   Coag's No results found for this basename: APTT, INR,  in the last  168 hours BMET  Recent Labs Lab 03/06/14 1210 03/08/14 0245  NA 138 137  K 3.7 3.3*  CL 97 102  CO2 25 21  BUN 10 13  CREATININE 0.51 0.42*  GLUCOSE 158* 159*   Electrolytes  Recent Labs Lab 03/06/14 1210 03/08/14 0245  CALCIUM 9.4 9.1  MG  --  1.9  PHOS  --  1.9*   Sepsis Markers  Recent Labs Lab 03/06/14 1230  LATICACIDVEN 3.44*   ABG  Recent Labs Lab 03/06/14 1223 03/08/14 0251  PHART 7.460* 7.499*  PCO2ART 36.2 30.3*  PO2ART 125.0* 174.0*   Liver Enzymes  Recent Labs Lab 03/06/14 1210  AST 30  ALT 19  ALKPHOS 67  BILITOT 0.4  ALBUMIN 3.9   Cardiac Enzymes No results found for this basename: TROPONINI, PROBNP,  in the last 168 hours Glucose  Recent Labs Lab 03/07/14 1931 03/07/14 2337 03/08/14 0359 03/08/14 0828  GLUCAP 158* 172* 157* 134*    Imaging Portable Chest Xray In Am  03/07/2014   CLINICAL DATA:  Intubation, followup  EXAM: PORTABLE CHEST - 1 VIEW  COMPARISON:  Portable exam 0625 hr compared to 03/06/2014  FINDINGS: Overpenetrated exam limits assessment of lung fields.  Tip of endotracheal tube projects 5.3 cm above carina.  Interval removal of  nasogastric tube.  Numerous EKG leads project over chest vertically RIGHT upper lobe.  Normal heart size and pulmonary vascularity.  Calcified tortuous thoracic aorta.  Prominent skin fold projects over LEFT chest.  No gross acute infiltrate, pleural effusion or pneumothorax.  IMPRESSION: Limited assessment of lung fields due to over penetration.  No definite acute abnormalities.   Electronically Signed   By: Lavonia Dana M.D.   On: 03/07/2014 08:21   ASSESSMENT / PLAN:  PULMONARY OETT 9/26 A: acute resp failure      Possible aspiration pna    - does not meet sbt criteria due to coma P:   Full vent support Empiric abx see ID section  See neuro section   CARDIOVASCULAR CVL A:  htn    - off cardene P:  r goal bp 130-150   RENAL A:   Low phos P:   Replete phos Avoid  hypotension Renal dose meds  F/u chem in am   GASTROINTESTINAL A:   No acute  P:   SUP w/ PPI   HEMATOLOGIC A:  No acute  P:  PAS due to [place] Trend CBC   INFECTIOUS A:  prob aspiration  P:   Sputum 9/26>>> Abx: unasyn  start date 9/26, day 1/x>>>  ENDOCRINE A:   Hyperglycemia  P:   Trend glucose  SSI if glucose > 150 X 2 on accu checks   NEUROLOGIC A:   Acute encephalopathy/ COMA in setting of ICH , GCS 4  P:   RASS goal: 0 Supportive care  Carotid dopplers  BP control w/ cardene   TODAY'S SUMMARY: 80 year old female who has had a devastating ICH. She is unresponsive and on vent. Prognosis is very poor. Family wants to cont supportive care but does not want to prolong things if hope for meaningful recovery is poor as of 03/06/14.  On 03/08/14 - neuro plans to discuss with family abuot goals of care.   The patient is critically ill with multiple organ systems failure and requires high complexity decision making for assessment and support, frequent evaluation and titration of therapies, application of advanced monitoring technologies and extensive interpretation of multiple databases.   Critical Care Time devoted to patient care services described in this note is 30 Minutes.    Dr. Brand Males, M.D., [facility].C.P Pulmonary and Critical Care Medicine Staff Physician [facility] Pager: [phone], If no answer or between  15:00h - 7:00h: call [phone]  03/08/2014 9:14 AM

## 2014-03-08 NOTE — Progress Notes (Signed)
RN spoke with patients son Sephora Boyar regarding patients wishes for artificial nutrition. At this time, patients son would like patient to start receiving nutrition. MD order to insert NGT or OGT to infuse vital high protein tube feed. This RN attempted to insert OGT x1 and NGT tube with no success. 2nd RN Joellen Jersey attempted to insert NGT with no success.  3rd RN Joelene Millin also attempted and was able to get panda tube to 55cm. ABD XRAY confirmed panda was coiled within hiatal hernia.  Will remove current panda as it is not in correct location. Dr Erlinda Hong notified and to place order for panda to be inserted by fluoro. Patient resting in NAD. Will continue to monitor. 03/08/2014 4:57 PM  Erminio Nygard, [place], RN

## 2014-03-08 NOTE — Progress Notes (Addendum)
Patient's son Genowefa Morga informed RN he is meeting with Stanton Kidney from palliative care tomorrow 9/29. Meeting is tentatively scheduled for 10:00am.  Edin Kon, [place]

## 2014-03-08 NOTE — Progress Notes (Signed)
OT Cancellation Note  Patient Details Name: Maureen Weiss MRN: [id number] DOB: 09/16/1920   Cancelled Treatment:    Reason Eval/Treat Not Completed: Other (comment) Pt is total comfort care. OT signing off. [place], OTR/L  [phone] 03/08/2014 03/08/2014, 10:05 AM

## 2014-03-08 NOTE — Progress Notes (Signed)
INITIAL NUTRITION ASSESSMENT  DOCUMENTATION CODES Per approved criteria  -Severe malnutrition in the context of chronic illness -Underweight   INTERVENTION:  If aggressive care and enteral access available:  Initiate Vital High Protein @ 20 ml/hr and increase by 10 ml every 4 hours to goal rate of 40 ml/hr. (already ordered)  Tube feeding regimen provides 960 kcal (107% of needs), 84 grams of protein, and 802 ml of H2O.   NUTRITION DIAGNOSIS: Inadequate oral intake related to inability to eat as evidenced by NPO status  Goal: Pt to meet >/= 90% of their estimated nutrition needs   Monitor:  TF tolerance, weight trends, labs  Reason for Assessment: Consult received to initiate and manage enteral nutrition support.  80 y.o. female  ASSESSMENT: Pt admitted on 9/26 with large left ICH. Pt intubated. Per MD prognosis very poor. Family currently wants to continue supportive care. Family meeting with neuro planned 9/28.   Patient is currently intubated on ventilator support MV: 5.3 L/min Temp (24hrs), Avg:98.8 F (37.1 C), Min:95.5 F (35.3 C), Max:99.9 F (37.7 C)  Propofol: none Potassium and Phosphorus low on IV replacement.   Nutrition and weight hx unknown.  Pt discussed during ICU rounds and with RN. Per RN no enteral access currently. Family meeting today to determine goals of care.   Nutrition Focused Physical Exam:  Subcutaneous Fat:  Orbital Region: WDL Upper Arm Region: severe depletion  Thoracic and Lumbar Region: severe depletion   Muscle:  Temple Region: WDL Clavicle Bone Region: severe depletion  Clavicle and Acromion Bone Region: severe depletion Scapular Bone Region:  NA Dorsal Hand: severe depletion  Patellar Region: severe depletion  Anterior Thigh Region: severe depletion  Posterior Calf Region: severe depletion   Edema: present right arm     Height: Ht Readings from Last 1 Encounters:  03/06/14 5' (1.524 m)    Weight: Wt Readings  from Last 1 Encounters:  03/08/14 92 lb 9.5 oz (42 kg)    Ideal Body Weight: 45.4 kg   % Ideal Body Weight: 93%  Wt Readings from Last 10 Encounters:  03/08/14 92 lb 9.5 oz (42 kg)  09/14/11 100 lb 1.4 oz (45.4 kg)    Usual Body Weight: unknown  % Usual Body Weight: -  BMI:  Body mass index is 18.08 kg/(m^2).  Estimated Nutritional Needs: Kcal: 900 Protein: >/= 63 grams Fluid: > 1.5 L/day  Skin: intact  Diet Order: NPO  EDUCATION NEEDS: -No education needs identified at this time   Intake/Output Summary (Last 24 hours) at 03/08/14 1017 Last data filed at 03/08/14 0900  Gross per 24 hour  Intake 2755.33 ml  Output    860 ml  Net 1895.33 ml    Last BM: 9/27 smear   Labs:   Recent Labs Lab 03/06/14 1210 03/08/14 0245  NA 138 137  K 3.7 3.3*  CL 97 102  CO2 25 21  BUN 10 13  CREATININE 0.51 0.42*  CALCIUM 9.4 9.1  MG  --  1.9  PHOS  --  1.9*  GLUCOSE 158* 159*    CBG (last 3)   Recent Labs  03/07/14 2337 03/08/14 0359 03/08/14 0828  GLUCAP 172* 157* 134*    Scheduled Meds: .  stroke: mapping our early stages of recovery book   Does not apply Once  . albuterol  2.5 mg Nebulization Q4H  . ampicillin-sulbactam (UNASYN) IV  1.5 g Intravenous Q8H  . antiseptic oral rinse  7 mL Mouth Rinse QID  .  chlorhexidine  15 mL Mouth Rinse BID  . feeding supplement (VITAL HIGH PROTEIN)  1,000 mL Per Tube Q24H  . pantoprazole (PROTONIX) IV  40 mg Intravenous Daily  . potassium phosphate IVPB (mmol)  15 mmol Intravenous Once  . senna-docusate  1 tablet Oral BID    Continuous Infusions: . sodium chloride 75 mL/hr at 03/08/14 0700  . niCARDipine Stopped (03/08/14 0600)    Past Medical History  Diagnosis Date  . Hypertension   . Osteoarthritis   . Colon cancer     Past Surgical History  Procedure Laterality Date  . Colon surgery      [place], [place], [place] Pager [phone] After Hours Pager

## 2014-03-08 NOTE — Progress Notes (Signed)
STROKE TEAM PROGRESS NOTE   HISTORY Maureen Weiss is an 80 y.o. female who lives alone and is able to perform all of her ADL's. Was fine when she talked to her son at 28 to tell him that the power was out. When he called to check on her later in the day she did not answer. When he went to the house she was in the bed and unresponsive. EMS was called at that time and he attempted to start chest compressions prior to their arrival. [name] was brought in for evaluation and was not able to protect her airway. She required intubation at that time. It is unclear if the patient was taking her antihypertensives at home. She was last known well 03/06/2014 at 03:30. She was not a tPA candidate given ICH.atie   SUBJECTIVE (INTERVAL HISTORY) Son at bedside. He showed me pt living wills which stated pt would like terminate care if pt condition terminal and incurable or pt in persistent vegetative state. Son request continue current care but no escalation of further aggressive care. He OK with feeding tube but no repeat CT head. He will think about trach or PEG.   OBJECTIVE Temp:  [95.5 F (35.3 C)-100.4 F (38 C)] 99.9 F (37.7 C) (09/28 1600) Pulse Rate:  [80-102] 102 (09/28 1600) Cardiac Rhythm:  [-] Normal sinus rhythm (09/28 0800) Resp:  [14-21] 20 (09/28 1600) BP: (122-158)/(50-88) 158/73 mmHg (09/28 1600) SpO2:  [99 %-100 %] 100 % (09/28 1600) FiO2 (%):  [30 %-40 %] 40 % (09/28 1600) Weight:  [42 kg (92 lb 9.5 oz)] 42 kg (92 lb 9.5 oz) (09/28 0500)  Recent Labs Lab 03/07/14 1931 03/07/14 2337 03/08/14 0359 03/08/14 0828  GLUCAP 158* 172* 157* 134*    Recent Labs Lab 03/06/14 1210 03/08/14 0245  NA 138 137  K 3.7 3.3*  CL 97 102  CO2 25 21  GLUCOSE 158* 159*  BUN 10 13  CREATININE 0.51 0.42*  CALCIUM 9.4 9.1  MG  --  1.9  PHOS  --  1.9*    Recent Labs Lab 03/06/14 1210  AST 30  ALT 19  ALKPHOS 67  BILITOT 0.4  PROT 7.5  ALBUMIN 3.9    Recent Labs Lab 03/06/14 1210  03/08/14 0245  WBC 13.2* 16.7*  NEUTROABS 11.9*  --   HGB 14.1 13.5  HCT 42.5 41.2  MCV 91.4 92.6  PLT 264 282    Recent Labs Lab 03/06/14 1210  CKTOTAL 98   No results found for this basename: LABPROT, INR,  in the last 72 hours  Recent Labs  03/06/14 1217  COLORURINE YELLOW  LABSPEC 1.007  PHURINE 7.5  GLUCOSEU 250*  HGBUR SMALL*  BILIRUBINUR NEGATIVE  KETONESUR NEGATIVE  PROTEINUR NEGATIVE  UROBILINOGEN 0.2  NITRITE NEGATIVE  LEUKOCYTESUR NEGATIVE    Ct Head Wo Contrast 03/06/2014    Left parenchymal hemorrhage centered near the left thalamus. There is a large amount of intraventricular blood, particularly in the left lateral ventricle.  Temporal horns are prominent and raise concern for developing hydrocephalus.  Evidence for chronic small vessel ischemic changes.  Chronic right maxillary sinus disease.    Dg Chest Portable 1 View 03/08/2014 Stable. Hyperexpansion without acute cardiopulmonary findings. 03/07/2014 Limited assessment of lung fields due to over penetration.  No definite acute abnormalities.  03/06/2014    1. Atypical course of the nasogastric tube raising the question of 2 within the left mainstem bronchus and left lower lobe of the lung versus hiatal  hernia.  03/06/2014    1. Endotracheal tube in good anatomic position. NG tube tip noted projected over the left chest. Although this may be in a hiatal hernia, repositioning is suggested.  2. Infiltrate right lung base cannot be excluded.      Carotid Doppler  No evidence of hemodynamically significant internal carotid artery stenosis. Vertebral artery flow is antegrade.   2D Echocardiogram  - Mild LVH with LVEF 15-40%, grade 1 diastolic dysfunction. Sclerotic aortic valve with mild aortic regurgitation. Mild mitral and tricuspid regugitation. Normal PASP 23 mmHg. Hypermobile interatrial septum without obvious PFO. Agitated saline study could provide further assessment.    PHYSICAL EXAM  Temp:  [95.5  F (35.3 C)-100.6 F (38.1 C)] 100 F (37.8 C) (09/28 1915) Pulse Rate:  [78-102] 79 (09/28 1946) Resp:  [14-29] 14 (09/28 1946) BP: (122-161)/(50-88) 150/67 mmHg (09/28 1946) SpO2:  [98 %-100 %] 98 % (09/28 1946) FiO2 (%):  [30 %-40 %] 40 % (09/28 1946) Weight:  [92 lb 9.5 oz (42 kg)] 92 lb 9.5 oz (42 kg) (09/28 0500)  General - Well nourished, well developed, intubated but not on sedation.  Ophthalmologic - not able to see through.  Cardiovascular - Regular rate and rhythm with no murmur.  Neuro - intubated and not open eyes on voice, did not follow commands. PERRL with right surgical pupil, doll's eye present, corneal and gag positive. Localized to pain on LUE and withdraw to pain on LLE. 0/5 RUE and trace withdraw to pain on RLE. Reflex 1+ and left babinski positive and right mute.   ASSESSMENT/PLAN [name] is a 80 y.o. female with history of hypertension and colon cancer presenting with unresponsiveness. CT confirms left parenchymal hemorrhage centered near the left thalamus with intraventricular blood, particularly in the left lateral ventricle.  Temporal horns are prominent and raise concern for developing hydrocephalus.   Stroke -  left thalamic parenchymal hemorrhage with IVH and prominent temporal horns  CT showed left subcortical ICH with IVH and possible hydrocephalus.  Carotid Doppler - no significant stenosis  2D Echo - no source of embolus  no antithrombotics prior to admission, now on no antithrombotics secondary to hemorrhage.   SCDs for VTE prophylaxis  NPO, will put DHT for tube feeding  Bedrest  DNR  Son requested not to escalate current management. Refused CT repeat, MRI, surgical intervention, or unnecessary blood tests.  Hypertension, Malignant   BP 203/104 on arrival. Cardene drip initially. Now off.  BP goal  < 160/90 BP: (122-158)/(50-88) 158/73 mmHg (09/28 1600)  stable now   Pneumonia - on unasyn - vent per CCM - CXR  improving  Respiratory failure - on vent per CCM - son will decide on trach later  Other Stroke Risk Factors Advanced age  Other Active Problems  Hypokalemia 3.3  Breast cancer, not active  Hospital day # 2  Maureen BIBY, MSN, RN, ANVP-BC, ANP-BC, [facility] Pager: [phone] 03/08/2014 4:51 PM   This patient is critically ill due to [place] with IVH, hydrocephalus and at significant risk of neurological worsening, death form hematoma expansion, hydrocephalus, brain edema and cerebral herniation. This patient's care requires constant monitoring of vital signs, hemodynamics, respiratory and cardiac monitoring, review of multiple databases, neurological assessment, discussion with family, other specialists and medical decision making of high complexity. I spent 45 minutes of neurocritical care time in the care of this patient.  Rosalin Hawking, MD PhD Stroke Neurology 03/08/2014 8:29 PM    To contact  Stroke Continuity provider, please refer to [email or web address] After hours, contact General Neurology

## 2014-03-08 NOTE — Progress Notes (Signed)
PT Cancellation and Discharge Note  Patient Details Name: Maureen Weiss MRN: [id number] DOB: Nov 17, 1920   Cancelled Treatment:    Reason Eval/Treat Not Completed: Other (comment)  Spoke with RN who indicates plan is for [name] today.  Will sign off and need new orders if pt becomes appropriate for therapy.     Luretha Eberly, [place] Papas 03/08/2014, 8:40 AM

## 2014-03-09 ENCOUNTER — Inpatient Hospital Stay (HOSPITAL_COMMUNITY): Payer: Medicare Other

## 2014-03-09 DIAGNOSIS — Z515 Encounter for palliative care: Secondary | ICD-10-CM

## 2014-03-09 DIAGNOSIS — Z66 Do not resuscitate: Secondary | ICD-10-CM

## 2014-03-09 DIAGNOSIS — I619 Nontraumatic intracerebral hemorrhage, unspecified: Secondary | ICD-10-CM

## 2014-03-09 DIAGNOSIS — R52 Pain, unspecified: Secondary | ICD-10-CM

## 2014-03-09 LAB — GLUCOSE, CAPILLARY
GLUCOSE-CAPILLARY: 112 mg/dL — AB (ref 70–99)
Glucose-Capillary: 100 mg/dL — ABNORMAL HIGH (ref 70–99)
Glucose-Capillary: 100 mg/dL — ABNORMAL HIGH (ref 70–99)
Glucose-Capillary: 104 mg/dL — ABNORMAL HIGH (ref 70–99)
Glucose-Capillary: 107 mg/dL — ABNORMAL HIGH (ref 70–99)
Glucose-Capillary: 123 mg/dL — ABNORMAL HIGH (ref 70–99)

## 2014-03-09 LAB — URINALYSIS W MICROSCOPIC (NOT AT ARMC)
Bilirubin Urine: NEGATIVE
Glucose, UA: NEGATIVE mg/dL
KETONES UR: 15 mg/dL — AB
Leukocytes, UA: NEGATIVE
Nitrite: NEGATIVE
Protein, ur: NEGATIVE mg/dL
Specific Gravity, Urine: 1.014 (ref 1.005–1.030)
UROBILINOGEN UA: 0.2 mg/dL (ref 0.0–1.0)
pH: 6.5 (ref 5.0–8.0)

## 2014-03-09 LAB — CBC
HEMATOCRIT: 39.5 % (ref 36.0–46.0)
Hemoglobin: 13.3 g/dL (ref 12.0–15.0)
MCH: 30.4 pg (ref 26.0–34.0)
MCHC: 33.7 g/dL (ref 30.0–36.0)
MCV: 90.4 fL (ref 78.0–100.0)
Platelets: 277 10*3/uL (ref 150–400)
RBC: 4.37 MIL/uL (ref 3.87–5.11)
RDW: 14.1 % (ref 11.5–15.5)
WBC: 13.3 10*3/uL — ABNORMAL HIGH (ref 4.0–10.5)

## 2014-03-09 LAB — BASIC METABOLIC PANEL
Anion gap: 14 (ref 5–15)
BUN: 11 mg/dL (ref 6–23)
CO2: 22 mEq/L (ref 19–32)
CREATININE: 0.38 mg/dL — AB (ref 0.50–1.10)
Calcium: 8.6 mg/dL (ref 8.4–10.5)
Chloride: 103 mEq/L (ref 96–112)
GFR calc non Af Amer: >90 mL/min (ref >90)
Glucose, Bld: 104 mg/dL — ABNORMAL HIGH (ref 70–99)
POTASSIUM: 3.6 meq/L — AB (ref 3.7–5.3)
Sodium: 139 mEq/L (ref 137–147)

## 2014-03-09 LAB — CULTURE, RESPIRATORY

## 2014-03-09 LAB — CULTURE, RESPIRATORY W GRAM STAIN

## 2014-03-09 MED ORDER — HYDRALAZINE HCL 20 MG/ML IJ SOLN
10.0000 mg | Freq: Four times a day (QID) | INTRAMUSCULAR | Status: DC | PRN
  Administered 2014-03-09: 40 mg via INTRAVENOUS
  Administered 2014-03-09 (×2): 10 mg via INTRAVENOUS
  Administered 2014-03-10 (×2): 40 mg via INTRAVENOUS
  Filled 2014-03-09 (×2): qty 2
  Filled 2014-03-09 (×2): qty 1
  Filled 2014-03-09 (×2): qty 2

## 2014-03-09 MED ORDER — FENTANYL CITRATE 0.05 MG/ML IJ SOLN
25.0000 ug | INTRAMUSCULAR | Status: DC | PRN
  Administered 2014-03-09: 100 ug via INTRAVENOUS
  Administered 2014-03-09 (×2): 50 ug via INTRAVENOUS
  Administered 2014-03-11: 100 ug via INTRAVENOUS
  Administered 2014-03-11: 50 ug via INTRAVENOUS
  Filled 2014-03-09 (×5): qty 2

## 2014-03-09 NOTE — Progress Notes (Signed)
STROKE TEAM PROGRESS NOTE   HISTORY Maureen Weiss is an 80 y.o. female who lives alone and is able to perform all of her ADL's. Was fine when she talked to her son at 54 to tell him that the power was out. When he called to check on her later in the day she did not answer. When he went to the house she was in the bed and unresponsive. EMS was called at that time and he attempted to start chest compressions prior to their arrival. [name] was brought in for evaluation and was not able to protect her airway. She required intubation at that time. It is unclear if the patient was taking her antihypertensives at home. She was last known well 03/06/2014 at 03:30. She was not a tPA candidate given ICH.atie   SUBJECTIVE (INTERVAL HISTORY) Son at bedside. He is speaking with Dr. Erlinda Hong related to plan of care - he is concerned that he may go against her living well as she is not in a persistent vegetative state. He is asking about speaking to an ethicist. After talking to palliative care, son leaning towards withdraw care tomorrow.   OBJECTIVE Temp:  [98.8 F (37.1 C)-100.6 F (38.1 C)] 98.8 F (37.1 C) (09/29 0800) Pulse Rate:  [63-102] 69 (09/29 0900) Cardiac Rhythm:  [-] Normal sinus rhythm (09/29 0800) Resp:  [14-30] 16 (09/29 0900) BP: (129-177)/(57-108) 157/66 mmHg (09/29 0900) SpO2:  [98 %-100 %] 100 % (09/29 0900) FiO2 (%):  [40 %] 40 % (09/29 0805) Weight:  [43 kg (94 lb 12.8 oz)] 43 kg (94 lb 12.8 oz) (09/29 0420)  CBG (last 3)   Recent Labs  03/09/14 0001 03/09/14 0337 03/09/14 0813  GLUCAP 123* 112* 100*   BMET    Component Value Date/Time   NA 137 03/08/2014 0245   K 3.3* 03/08/2014 0245   CL 102 03/08/2014 0245   CO2 21 03/08/2014 0245   GLUCOSE 159* 03/08/2014 0245   BUN 13 03/08/2014 0245   CREATININE 0.42* 03/08/2014 0245   CALCIUM 9.1 03/08/2014 0245   GFRNONAA 88* 03/08/2014 0245   GFRAA >90 03/08/2014 0245   CBC    Component Value Date/Time   WBC 16.7* 03/08/2014 0245    WBC 6.9 08/28/2007 0842   RBC 4.45 03/08/2014 0245   RBC 3.35* 08/28/2007 0842   HGB 13.5 03/08/2014 0245   HGB 9.8* 08/28/2007 0842   HCT 41.2 03/08/2014 0245   HCT 28.8* 08/28/2007 0842   PLT 282 03/08/2014 0245   PLT 422* 08/28/2007 0842   MCV 92.6 03/08/2014 0245   MCV 85.7 08/28/2007 0842   MCH 30.3 03/08/2014 0245   MCH 29.2 08/28/2007 0842   MCHC 32.8 03/08/2014 0245   MCHC 34.0 08/28/2007 0842   RDW 14.0 03/08/2014 0245   RDW 13.3 08/28/2007 0842   LYMPHSABS 0.7 03/06/2014 1210   LYMPHSABS 1.2 08/28/2007 0842   MONOABS 0.6 03/06/2014 1210   MONOABS 0.6 08/28/2007 0842   EOSABS 0.0 03/06/2014 1210   EOSABS 0.3 08/28/2007 0842   BASOSABS 0.0 03/06/2014 1210   BASOSABS 0.0 08/28/2007 0842    Ct Head Wo Contrast 03/06/2014    Left parenchymal hemorrhage centered near the left thalamus. There is a large amount of intraventricular blood, particularly in the left lateral ventricle.  Temporal horns are prominent and raise concern for developing hydrocephalus.  Evidence for chronic small vessel ischemic changes.  Chronic right maxillary sinus disease.    Dg Chest Portable 1 View 03/08/2014 Stable. Hyperexpansion  without acute cardiopulmonary findings. 03/07/2014 Limited assessment of lung fields due to over penetration.  No definite acute abnormalities.  03/06/2014    1. Atypical course of the nasogastric tube raising the question of 2 within the left mainstem bronchus and left lower lobe of the lung versus hiatal hernia.  03/06/2014    1. Endotracheal tube in good anatomic position. NG tube tip noted projected over the left chest. Although this may be in a hiatal hernia, repositioning is suggested.  2. Infiltrate right lung base cannot be excluded.      Carotid Doppler  No evidence of hemodynamically significant internal carotid artery stenosis. Vertebral artery flow is antegrade.   2D Echocardiogram  - Mild LVH with LVEF 22-02%, grade 1 diastolic dysfunction. Sclerotic aortic valve with mild aortic  regurgitation. Mild mitral and tricuspid regugitation. Normal PASP 23 mmHg. Hypermobile interatrial septum without obvious PFO. Agitated saline study could provide further assessment.    PHYSICAL EXAM Temp:  [98.8 F (37.1 C)-100.6 F (38.1 C)] 98.8 F (37.1 C) (09/29 0800) Pulse Rate:  [63-102] 69 (09/29 0900) Resp:  [14-30] 16 (09/29 0900) BP: (129-177)/(57-108) 157/66 mmHg (09/29 0900) SpO2:  [98 %-100 %] 100 % (09/29 0900) FiO2 (%):  [40 %] 40 % (09/29 0805) Weight:  [43 kg (94 lb 12.8 oz)] 43 kg (94 lb 12.8 oz) (09/29 0420)  General - Well nourished, well developed, intubated but not on sedation.  Ophthalmologic - not able to see through.  Cardiovascular - Regular rate and rhythm with no murmur.  Neuro - intubated but open eyes on voice, did not follow commands. PERRL with right surgical pupil, doll's eye present, corneal and gag positive. Localized to pain on LUE and withdraw to pain on LLE. 0/5 RUE and trace withdraw to pain on RLE. Reflex 1+ and left babinski positive and right mute.   ASSESSMENT/PLAN Maureen Weiss is a 80 y.o. female with history of hypertension and colon cancer presenting with unresponsiveness. Initial CT confirms left parenchymal hemorrhage centered near the left thalamus with intraventricular blood, particularly in the left lateral ventricle.  Temporal horns are prominent and raise concern for developing hydrocephalus.   Stroke -  left thalamic parenchymal hemorrhage with IVH and prominent temporal horns  CT showed left subcortical ICH with IVH and possible hydrocephalus.  Carotid Doppler - no significant stenosis  2D Echo - EF 55-60%  no antithrombotics prior to admission, now on no antithrombotics secondary to hemorrhage.   SCDs for VTE prophylaxis  NPO  Bedrest  DNR  Son requested not to escalate current management. Refused CT repeat, MRI, surgical intervention, or unnecessary blood tests. Will not do any testing without son's  approval.  Palliative care consult, they spoke with son and son leaning towards comfort care. Will decide in am.  Dysphagia  Secondary to stroke  Unable to place panda yesterday at bedside due to hernia  Will place under fluoro today at 11a - hold off as son leaning toward withdraw care.  Hypertension, Malignant   BP 203/104 on arrival. Cardene drip initially. Now off.  BP goal  < 160/90 BP 129-177/61-75 past 24h (03/09/2014 @ 9:34 AM) Start oral meds via tube once placed Stable   Pneumonia   on unasyn   vent per CCM   CXR improving  CBC trending down  Respiratory failure  Intubated, on vent per CCM  They will assess if a weaning candidate  Son will decide on trach later if needed  Other Stroke Risk Factors Advanced age  Other Active Problems  Leukocytosis, 13.2->16.7->13.3, repeat UA negative for UTI  Hypokalemia 3.7->3.3 ->3.6. Check BMET & CBC  Breast cancer, not active   Hospital day # 2  SHARON BIBY, MSN, RN, ANVP-BC, [facility] Pager: [phone] 03/09/2014 9:34 AM   This patient is critically ill due to [place] with IVH, hydrocephalus and at significant risk of neurological worsening, death form hematoma expansion, hydrocephalus, brain edema and cerebral herniation. This patient's care requires constant monitoring of vital signs, hemodynamics, respiratory and cardiac monitoring, review of multiple databases, neurological assessment, discussion with family, other specialists and medical decision making of high complexity. I spent 45 minutes of neurocritical care time in the care of this patient.  Maureen Hawking, MD PhD Stroke Neurology 03/09/2014 4:34 PM   To contact Stroke Continuity provider, please refer to [email or web address] After hours, contact General Neurology

## 2014-03-09 NOTE — Progress Notes (Signed)
Spoke with nurse regarding consult and nurse indicated that patient son Richardson Landry wanted Chaplain visit tomorrow before consulting with Doctor.  Family plans to withdraw life support.  Will pass on to unit chapain for follow up.

## 2014-03-09 NOTE — Consult Note (Signed)
Patient Maureen Weiss      DOB: 1921-01-01      IRJ:[id number]     Consult Note from the Palliative Medicine Team at [place] Requested by: Dr Maureen Weiss    PCP: Maureen Shelling, MD Reason for Consultation: Clarification GOC and options     Phone [phone]  Assessment of patients Current state:   CT head showed left thalamic hemorrhage with extension into the ventricles.  Family faced with advanced directive decisions and anticipatory care needs.   Consult is for review of medical treatment options, clarification of goals of care and end of life issues, disposition and options, and symptom recommendation.  This NP Wadie Lessen reviewed medical records, received report from team, assessed the patient and then meet at the patient's bedside along with her son Maureen Weiss  to discuss diagnosis prognosis, [place], EOL wishes disposition and options.  A detailed discussion was had today regarding advanced directives.  Concepts specific to code status, artifical feeding and hydration, continued IV antibiotics and rehospitalization was had.  The difference between a aggressive medical intervention path  and a palliative comfort care path for this patient at this time was had.  Values and goals of care important to patient and family were attempted to be elicited.  Concept of Hospice and Palliative Care were discussed  Natural trajectory and expectations at EOL were discussed.  Questions and concerns addressed.  Hard Choices booklet left for review. Family encouraged to call with questions or concerns.  PMT will continue to support holistically.   Goals of Care: 1.  Code Status:  DNR/DNI   2. Scope of Treatment: Continue current medical interventions.   Son needs the next 24 hrs to process the situation and feel comfortable with his decisions, leaning to one way extubation and a shift to full comfort.  He will meet with me in the morning at 1030  3. Disposition:   Dependant on options   4. Symptom Management:    1. Pain: Fentanyl 25-100 mcg IV every 2 hrs prn  5. Psychosocial:  Emotional support offered to son at bedside.  He continues to struggle with the specifics of his mother's advanced directive.  Attempted to answer his many questions.  He verbalizes a sense of satisfaction after the meeting and agrees to meet me in the morning, "I just want to make sure I'm doing the right thing"  6. Spiritual:  Chaplain consulted   Patient Documents Completed or Given: Document Given Completed  Advanced Directives Pkt    MOST yes   DNR  yes  Gone from [facility]    Hard Choices yes     Brief TKZ:SWFUXNA Maureen Weiss is an 80 y.o. female who lives alone and is able to perform all of her ADL's. Was fine when she talked to her son at 59 to tell him that the power was out. When he called to check on her later in the day she did not answer. When he went to the house she was in the bed and unresponsive. EMS was called at that time and he attempted to start chest compressions prior to their arrival. Patient was brought in for evaluation and was not able to protect her airway. She required intubation at that time.    Head CT 03-06-14  Left parenchymal hemorrhage centered near the left thalamus. There  is a large amount of intraventricular blood, particularly in the  left lateral ventricle.  Temporal horns are prominent and raise concern for  developing  hydrocephalus.  Evidence for chronic small vessel ischemic changes.  Chronic right maxillary sinus disease.    ROS: unable to illicit, patient is intubated   PMH:  Past Medical History  Diagnosis Date  . Hypertension   . Osteoarthritis   . Colon cancer      PSH: Past Surgical History  Procedure Laterality Date  . Colon surgery     I have reviewed the FH and SH and  If appropriate update it with new information. Allergies  Allergen Reactions  . Neosporin [Neomycin-Polymyxin-Gramicidin]     Unknown   . Sulfa Antibiotics     Unknown   Scheduled Meds: .  stroke: mapping our early stages of recovery book   Does not apply Once  . albuterol  2.5 mg Nebulization Q4H  . ampicillin-sulbactam (UNASYN) IV  1.5 g Intravenous Q8H  . antiseptic oral rinse  7 mL Mouth Rinse QID  . chlorhexidine  15 mL Mouth Rinse BID  . feeding supplement (VITAL HIGH PROTEIN)  1,000 mL Per Tube Q24H  . pantoprazole (PROTONIX) IV  40 mg Intravenous Daily  . senna-docusate  1 tablet Oral BID   Continuous Infusions: . sodium chloride 75 mL/hr at 03/09/14 0948   PRN Meds:.acetaminophen, acetaminophen, hydrALAZINE    BP 154/67  Pulse 67  Temp(Src) 99 F (37.2 C) (Core (Comment))  Resp 14  Ht 5' (1.524 m)  Wt 43 kg (94 lb 12.8 oz)  BMI 18.51 kg/m2  SpO2 100%     Intake/Output Summary (Last 24 hours) at 03/09/14 1104 Last data filed at 03/09/14 1000  Gross per 24 hour  Intake   1875 ml  Output   1530 ml  Net    345 ml    Physical Exam:  General:  Ill appearing, intubated, sedated HEENT: moist buccal membranes, noted ET tube Chest:   Bilateral equal air movement, CTA CVS: RRR Abdomen:soft NT +BS Ext: without edema Neuro:unable to follow commands  Labs: CBC    Component Value Date/Time   WBC 16.7* 03/08/2014 0245   WBC 6.9 08/28/2007 0842   RBC 4.45 03/08/2014 0245   RBC 3.35* 08/28/2007 0842   HGB 13.5 03/08/2014 0245   HGB 9.8* 08/28/2007 0842   HCT 41.2 03/08/2014 0245   HCT 28.8* 08/28/2007 0842   PLT 282 03/08/2014 0245   PLT 422* 08/28/2007 0842   MCV 92.6 03/08/2014 0245   MCV 85.7 08/28/2007 0842   MCH 30.3 03/08/2014 0245   MCH 29.2 08/28/2007 0842   MCHC 32.8 03/08/2014 0245   MCHC 34.0 08/28/2007 0842   RDW 14.0 03/08/2014 0245   RDW 13.3 08/28/2007 0842   LYMPHSABS 0.7 03/06/2014 1210   LYMPHSABS 1.2 08/28/2007 0842   MONOABS 0.6 03/06/2014 1210   MONOABS 0.6 08/28/2007 0842   EOSABS 0.0 03/06/2014 1210   EOSABS 0.3 08/28/2007 0842   BASOSABS 0.0 03/06/2014 1210   BASOSABS 0.0  08/28/2007 0842    BMET    Component Value Date/Time   NA 137 03/08/2014 0245   K 3.3* 03/08/2014 0245   CL 102 03/08/2014 0245   CO2 21 03/08/2014 0245   GLUCOSE 159* 03/08/2014 0245   BUN 13 03/08/2014 0245   CREATININE 0.42* 03/08/2014 0245   CALCIUM 9.1 03/08/2014 0245   GFRNONAA 88* 03/08/2014 0245   GFRAA >90 03/08/2014 0245    CMP     Component Value Date/Time   NA 137 03/08/2014 0245   K 3.3* 03/08/2014 0245   CL 102 03/08/2014  0245   CO2 21 03/08/2014 0245   GLUCOSE 159* 03/08/2014 0245   BUN 13 03/08/2014 0245   CREATININE 0.42* 03/08/2014 0245   CALCIUM 9.1 03/08/2014 0245   PROT 7.5 03/06/2014 1210   ALBUMIN 3.9 03/06/2014 1210   AST 30 03/06/2014 1210   ALT 19 03/06/2014 1210   ALKPHOS 67 03/06/2014 1210   BILITOT 0.4 03/06/2014 1210   GFRNONAA 88* 03/08/2014 0245   GFRAA >90 03/08/2014 0245     Time In Time Out Total Time Spent with Patient Total Overall Time  1000 1130 75 min 90 min    Greater than 50%  of this time was spent counseling and coordinating care related to the above assessment and plan.   Wadie Lessen NP  Palliative Medicine Team Team Phone # [phone] Pager [phone]  Discussed with  Burnetta Sabin NP

## 2014-03-09 NOTE — Progress Notes (Signed)
Dr. Armida Sans notified of patient's SBP in 170's. Order received. Will monitor.

## 2014-03-09 NOTE — Progress Notes (Signed)
Plan earlier this morning was for NGT to be placed under xray. After palliative care discussion with son, Maureen Weiss, the plan is to likely withdraw care tomorrow around 1030. Called fluoro to update them on this change in plan and they will hold off on inserting NGT and asked for RN to call them back if son changes his mind. Dajana Gehrig, [place], RN 03/09/2014 12:04 PM

## 2014-03-09 NOTE — Progress Notes (Signed)
PULMONARY / CRITICAL CARE MEDICINE   Name: Maureen Weiss MRN: [id number] DOB: 1920-11-14    ADMISSION DATE:  03/06/2014 CONSULTATION DATE:  9/26   REFERRING MD :  Doy Mince   CHIEF COMPLAINT:  ICH and respiratory failure  INITIAL PRESENTATION:  80 year old female admitted on 9/26 w/ large L ICH. Intubated as not able to protect airway. PCCM asked to see for a/w vent and medical management.   STUDIES:  CT head 9/26: left thalamic hemorrhage with extension into the ventricles  SIGNIFICANT EVENTS: 9/26: confirmed DNR w/ pt's son  03/08/14: Unresponsive on vent - gcs 4. Gag +. Moves left upper arm.    SUBJECTIVE/OVERNIGHT/INTERVAL HX 03/09/14: A bit more awake. Neuro in family discussion now: concern son might want full code  VITAL SIGNS: Temp:  [98.8 F (37.1 C)-100.6 F (38.1 C)] 99 F (37.2 C) (09/29 1000) Pulse Rate:  [63-102] 67 (09/29 1000) Resp:  [14-30] 14 (09/29 1000) BP: (129-177)/(57-108) 154/67 mmHg (09/29 1000) SpO2:  [98 %-100 %] 100 % (09/29 1000) FiO2 (%):  [40 %] 40 % (09/29 0805) Weight:  [43 kg (94 lb 12.8 oz)] 43 kg (94 lb 12.8 oz) (09/29 0420) HEMODYNAMICS:   VENTILATOR SETTINGS: Vent Mode:  [-] PRVC FiO2 (%):  [40 %] 40 % Set Rate:  [14 bmp] 14 bmp Vt Set:  [400 mL] 400 mL PEEP:  [5 cmH20] 5 cmH20 Plateau Pressure:  [12 cmH20-14 cmH20] 12 cmH20 INTAKE / OUTPUT:  Intake/Output Summary (Last 24 hours) at 03/09/14 1041 Last data filed at 03/09/14 1000  Gross per 24 hour  Intake   2280 ml  Output   1530 ml  Net    750 ml   PHYSICAL EXAMINATION: General:  Unresponsive.  Neuro:  03/09/14: Eyes open. Moves limbs., Not following commands HEENT:  Poor dentition orally intubated. PERRL  Cardiovascular:  rrr Lungs:  CT A bilaterally Abdomen:  Soft, + bowel sounds  Musculoskeletal:  Intact  Skin:  Intact   LABS:  CBC  Recent Labs Lab 03/06/14 1210 03/08/14 0245  WBC 13.2* 16.7*  HGB 14.1 13.5  HCT 42.5 41.2  PLT 264 282   Coag's No  results found for this basename: APTT, INR,  in the last 168 hours BMET  Recent Labs Lab 03/06/14 1210 03/08/14 0245  NA 138 137  K 3.7 3.3*  CL 97 102  CO2 25 21  BUN 10 13  CREATININE 0.51 0.42*  GLUCOSE 158* 159*   Electrolytes  Recent Labs Lab 03/06/14 1210 03/08/14 0245  CALCIUM 9.4 9.1  MG  --  1.9  PHOS  --  1.9*   Sepsis Markers  Recent Labs Lab 03/06/14 1230  LATICACIDVEN 3.44*   ABG  Recent Labs Lab 03/06/14 1223 03/08/14 0251  PHART 7.460* 7.499*  PCO2ART 36.2 30.3*  PO2ART 125.0* 174.0*   Liver Enzymes  Recent Labs Lab 03/06/14 1210  AST 30  ALT 19  ALKPHOS 67  BILITOT 0.4  ALBUMIN 3.9   Cardiac Enzymes No results found for this basename: TROPONINI, PROBNP,  in the last 168 hours Glucose  Recent Labs Lab 03/08/14 1134 03/08/14 1623 03/08/14 1934 03/09/14 0001 03/09/14 0337 03/09/14 0813  GLUCAP 134* 137* 124* 123* 112* 100*    Imaging Dg Chest Port 1 View  03/08/2014   CLINICAL DATA:  Ventilator dependence.  EXAM: PORTABLE CHEST - 1 VIEW  COMPARISON:  Multiple recent previous exams.  FINDINGS: 0525 hrs. Lungs are hyperexpanded without edema or focal airspace consolidation. No pleural  effusion. Endotracheal tube tip is 4.1 cm above the base of the carina. Bones are diffusely demineralized. Telemetry leads overlie the chest.  IMPRESSION: Stable.  Hyperexpansion without acute cardiopulmonary findings.   Electronically Signed   By: Misty Stanley M.D.   On: 03/08/2014 07:36   Dg Abd Portable 1v  03/08/2014   CLINICAL DATA:  Feeding catheter placement  EXAM: PORTABLE ABDOMEN - 1 VIEW  COMPARISON:  None.  FINDINGS: Scattered large and small bowel gas is noted. A feeding catheter is noted coiled within a hiatal hernia. Scoliosis concave to the right is noted in the mid lumbar spine. No other focal abnormality is noted.  IMPRESSION: Feeding catheter is noted to be coiled within a hiatal hernia. Reattempt and reimaging is recommended.    Electronically Signed   By: Inez Catalina M.D.   On: 03/08/2014 16:43   ASSESSMENT / PLAN:  PULMONARY OETT 9/26 A: acute resp failure      Possible aspiration pna    - does not meet sbt criteria due to  Hemorrhage and CNS issues P:   Full vent support Empiric abx see ID section  See neuro section   CARDIOVASCULAR CVL A:  htn    - off cardene P:  r goal bp 130-150   RENAL A:   Normal creat P:   Avoid hypotension Renal dose meds  F/u chem in am   GASTROINTESTINAL A:   No acute  P:   SUP w/ PPI  TF start 03/10/14 if full code and full medica care  HEMATOLOGIC A:  No acute  P:  PAS due to [place] Trend CBC   INFECTIOUS A:  prob aspiration  P:   Sputum 9/26>>> Abx: unasyn  start date 9/26, day 1/x>>>  ENDOCRINE A:   Hyperglycemia  P:   Trend glucose  SSI if glucose > 150 X 2 on accu checks   NEUROLOGIC A:   Acute encephalopathy/ COMA in setting of ICH , GCS 4 at admit   - some better 03/09/14  P:   RASS goal: 0 Supportive care  Carotid dopplers  BP control w/ cardene   TODAY'S SUMMARY: 80 year old female who has had a devastating ICH. She is unresponsive and on vent. Prognosis is very poor. Family wants to cont supportive care but does not want to prolong things if hope for meaningful recovery is poor as of 03/06/14.  On 03/09/14 - neuro currently discussing goals of care: concern son might want full code   The patient is critically ill with multiple organ systems failure and requires high complexity decision making for assessment and support, frequent evaluation and titration of therapies, application of advanced monitoring technologies and extensive interpretation of multiple databases.   Critical Care Time devoted to patient care services described in this note is 30 Minutes.    Dr. Brand Males, M.D., [facility].C.P Pulmonary and Critical Care Medicine Staff Physician [facility] Pager: [phone], If  no answer or between  15:00h - 7:00h: call [phone]  03/09/2014 10:41 AM

## 2014-03-10 ENCOUNTER — Inpatient Hospital Stay (HOSPITAL_COMMUNITY): Payer: Medicare Other

## 2014-03-10 DIAGNOSIS — Z515 Encounter for palliative care: Secondary | ICD-10-CM

## 2014-03-10 DIAGNOSIS — R0600 Dyspnea, unspecified: Secondary | ICD-10-CM

## 2014-03-10 DIAGNOSIS — Z789 Other specified health status: Secondary | ICD-10-CM

## 2014-03-10 LAB — CBC WITH DIFFERENTIAL/PLATELET
BASOS PCT: 0 % (ref 0–1)
Basophils Absolute: 0 10*3/uL (ref 0.0–0.1)
EOS ABS: 0.1 10*3/uL (ref 0.0–0.7)
EOS PCT: 1 % (ref 0–5)
HCT: 39.2 % (ref 36.0–46.0)
Hemoglobin: 12.7 g/dL (ref 12.0–15.0)
LYMPHS ABS: 0.9 10*3/uL (ref 0.7–4.0)
Lymphocytes Relative: 7 % — ABNORMAL LOW (ref 12–46)
MCH: 30.3 pg (ref 26.0–34.0)
MCHC: 32.4 g/dL (ref 30.0–36.0)
MCV: 93.6 fL (ref 78.0–100.0)
Monocytes Absolute: 1.4 10*3/uL — ABNORMAL HIGH (ref 0.1–1.0)
Monocytes Relative: 10 % (ref 3–12)
NEUTROS PCT: 82 % — AB (ref 43–77)
Neutro Abs: 10.9 10*3/uL — ABNORMAL HIGH (ref 1.7–7.7)
Platelets: 301 10*3/uL (ref 150–400)
RBC: 4.19 MIL/uL (ref 3.87–5.11)
RDW: 14.2 % (ref 11.5–15.5)
WBC: 13.3 10*3/uL — ABNORMAL HIGH (ref 4.0–10.5)

## 2014-03-10 LAB — GLUCOSE, CAPILLARY
GLUCOSE-CAPILLARY: 104 mg/dL — AB (ref 70–99)
GLUCOSE-CAPILLARY: 102 mg/dL — AB (ref 70–99)
Glucose-Capillary: 116 mg/dL — ABNORMAL HIGH (ref 70–99)

## 2014-03-10 LAB — BASIC METABOLIC PANEL
ANION GAP: 13 (ref 5–15)
BUN: 16 mg/dL (ref 6–23)
CALCIUM: 8.8 mg/dL (ref 8.4–10.5)
CO2: 23 mEq/L (ref 19–32)
CREATININE: 0.45 mg/dL — AB (ref 0.50–1.10)
Chloride: 106 mEq/L (ref 96–112)
GFR calc non Af Amer: 86 mL/min — ABNORMAL LOW (ref >90)
Glucose, Bld: 113 mg/dL — ABNORMAL HIGH (ref 70–99)
Potassium: 2.7 mEq/L — CL (ref 3.7–5.3)
Sodium: 142 mEq/L (ref 137–147)

## 2014-03-10 LAB — PHOSPHORUS: Phosphorus: 2 mg/dL — ABNORMAL LOW (ref 2.3–4.6)

## 2014-03-10 LAB — MAGNESIUM: Magnesium: 2 mg/dL (ref 1.5–2.5)

## 2014-03-10 MED ORDER — POTASSIUM PHOSPHATES 15 MMOLE/5ML IV SOLN
10.0000 mmol | Freq: Once | INTRAVENOUS | Status: AC
  Administered 2014-03-10: 10 mmol via INTRAVENOUS
  Filled 2014-03-10: qty 3.33

## 2014-03-10 MED ORDER — POTASSIUM CHLORIDE 10 MEQ/100ML IV SOLN
10.0000 meq | INTRAVENOUS | Status: AC
  Administered 2014-03-10 (×4): 10 meq via INTRAVENOUS
  Filled 2014-03-10 (×4): qty 100

## 2014-03-10 MED ORDER — MORPHINE SULFATE 2 MG/ML IJ SOLN
2.0000 mg | INTRAMUSCULAR | Status: DC | PRN
  Administered 2014-03-11 (×3): 2 mg via INTRAVENOUS
  Filled 2014-03-10 (×3): qty 1

## 2014-03-10 MED ORDER — LORAZEPAM 2 MG/ML IJ SOLN
1.0000 mg | INTRAMUSCULAR | Status: DC
  Administered 2014-03-10 – 2014-03-11 (×7): 1 mg via INTRAVENOUS
  Filled 2014-03-10 (×7): qty 1

## 2014-03-10 MED ORDER — ALBUTEROL SULFATE (2.5 MG/3ML) 0.083% IN NEBU: 2.5000 mg | INHALATION_SOLUTION | RESPIRATORY_TRACT | Status: DC | PRN

## 2014-03-10 NOTE — Consult Note (Signed)
I have reviewed this case with our NP and agree with the Assessment and Plan as stated.  Satchel Heidinger L. Arshia Spellman, MD MBA The Palliative Medicine Team at [place] Team Phone: [phone] Pager: [phone]   

## 2014-03-10 NOTE — Consult Note (Signed)
[place] Liaison: Received request from [place] for family interest in [facility]. Chart reviewed and spoke with son by phone. He is agreeable to transfer tomorrow if transfer makes sense. Plan to meet him tomorrow morning at 9 am. Thank you. Erling Conte LCSW [phone]

## 2014-03-10 NOTE — Clinical Social Work Note (Signed)
Clinical Education officer, museum received return call from Harmon Pier at [facility] at 14:50 stating that patient has been accepted and bed available tomorrow.  Harmon Pier to meet with patient son and complete paperwork prior to patient admission tomorrow.  CSW remains available for support and to facilitate patient discharge needs.  Barbette Or, [place]

## 2014-03-10 NOTE — Progress Notes (Signed)
STROKE TEAM PROGRESS NOTE   HISTORY Maureen Weiss is an 80 y.o. female who lives alone and is able to perform all of her ADL's. Was fine when she talked to her son at 84 to tell him that the power was out. When he called to check on her later in the day she did not answer. When he went to the house she was in the bed and unresponsive. EMS was called at that time and he attempted to start chest compressions prior to their arrival. [name] was brought in for evaluation and was not able to protect her airway. She required intubation at that time. It is unclear if the patient was taking her antihypertensives at home. She was last known well 03/06/2014 at 03:30. She was not a tPA candidate given ICH.atie   SUBJECTIVE (INTERVAL HISTORY) Son at bedside. Pt still not following commands, but open eyes on voice and eyes remained open. Seems more awake alert than yesterday. Son has talked with palliative care and will proceed with comfort care.   OBJECTIVE Temp:  [97.5 F (36.4 C)-99.1 F (37.3 C)] 98.8 F (37.1 C) (09/30 0830) Pulse Rate:  [63-100] 99 (09/30 0830) Cardiac Rhythm:  [-] Normal sinus rhythm (09/30 0800) Resp:  [13-34] 17 (09/30 0830) BP: (123-183)/(55-83) 142/60 mmHg (09/30 0830) SpO2:  [97 %-100 %] 99 % (09/30 0830) FiO2 (%):  [40 %] 40 % (09/30 0851) Weight:  [97 lb (44 kg)] 97 lb (44 kg) (09/30 0321)  CBG (last 3)   Recent Labs  03/09/14 2352 03/10/14 0322 03/10/14 0818  GLUCAP 104* 104* 102*   BMET    Component Value Date/Time   NA 142 03/10/2014 0357   K 2.7* 03/10/2014 0357   CL 106 03/10/2014 0357   CO2 23 03/10/2014 0357   GLUCOSE 113* 03/10/2014 0357   BUN 16 03/10/2014 0357   CREATININE 0.45* 03/10/2014 0357   CALCIUM 8.8 03/10/2014 0357   GFRNONAA 86* 03/10/2014 0357   GFRAA >90 03/10/2014 0357   CBC    Component Value Date/Time   WBC 13.3* 03/10/2014 0357   WBC 6.9 08/28/2007 0842   RBC 4.19 03/10/2014 0357   RBC 3.35* 08/28/2007 0842   HGB 12.7 03/10/2014 0357    HGB 9.8* 08/28/2007 0842   HCT 39.2 03/10/2014 0357   HCT 28.8* 08/28/2007 0842   PLT 301 03/10/2014 0357   PLT 422* 08/28/2007 0842   MCV 93.6 03/10/2014 0357   MCV 85.7 08/28/2007 0842   MCH 30.3 03/10/2014 0357   MCH 29.2 08/28/2007 0842   MCHC 32.4 03/10/2014 0357   MCHC 34.0 08/28/2007 0842   RDW 14.2 03/10/2014 0357   RDW 13.3 08/28/2007 0842   LYMPHSABS 0.9 03/10/2014 0357   LYMPHSABS 1.2 08/28/2007 0842   MONOABS 1.4* 03/10/2014 0357   MONOABS 0.6 08/28/2007 0842   EOSABS 0.1 03/10/2014 0357   EOSABS 0.3 08/28/2007 0842   BASOSABS 0.0 03/10/2014 0357   BASOSABS 0.0 08/28/2007 0842    Ct Head Wo Contrast 03/06/2014    Left parenchymal hemorrhage centered near the left thalamus. There is a large amount of intraventricular blood, particularly in the left lateral ventricle.  Temporal horns are prominent and raise concern for developing hydrocephalus.  Evidence for chronic small vessel ischemic changes.  Chronic right maxillary sinus disease.    Dg Chest Portable 1 View 03/08/2014 Stable. Hyperexpansion without acute cardiopulmonary findings. 03/07/2014 Limited assessment of lung fields due to over penetration.  No definite acute abnormalities.  03/06/2014  1. Atypical course of the nasogastric tube raising the question of 2 within the left mainstem bronchus and left lower lobe of the lung versus hiatal hernia.  03/06/2014    1. Endotracheal tube in good anatomic position. NG tube tip noted projected over the left chest. Although this may be in a hiatal hernia, repositioning is suggested.  2. Infiltrate right lung base cannot be excluded.      Carotid Doppler  No evidence of hemodynamically significant internal carotid artery stenosis. Vertebral artery flow is antegrade.   2D Echocardiogram  - Mild LVH with LVEF 44-92%, grade 1 diastolic dysfunction. Sclerotic aortic valve with mild aortic regurgitation. Mild mitral and tricuspid regugitation. Normal PASP 23 mmHg. Hypermobile interatrial septum  without obvious PFO. Agitated saline study could provide further assessment.   Abd xray 03/08/2014  Feeding catheter is noted to be coiled within a hiatal hernia. Reattempt and reimaging is recommended.   PHYSICAL EXAM Temp:  [97.5 F (36.4 C)-99.1 F (37.3 C)] 98.8 F (37.1 C) (09/30 0830) Pulse Rate:  [63-100] 99 (09/30 0830) Resp:  [13-34] 17 (09/30 0830) BP: (123-183)/(55-83) 142/60 mmHg (09/30 0830) SpO2:  [97 %-100 %] 99 % (09/30 0830) FiO2 (%):  [40 %] 40 % (09/30 0851) Weight:  [97 lb (44 kg)] 97 lb (44 kg) (09/30 0321)  General - Well nourished, well developed, intubated but not on sedation.  Ophthalmologic - not able to see through.  Cardiovascular - Regular rate and rhythm with no murmur.  Neuro - intubated but open eyes on voice, did not follow commands. PERRL with right surgical pupil, doll's eye present, corneal and gag positive. Localized to pain on LUE and withdraw to pain on LLE. 0/5 RUE and trace withdraw to pain on RLE. Reflex 1+ and left babinski positive and right mute.   ASSESSMENT/PLAN [name] is a 80 y.o. female with history of hypertension and colon cancer presenting with unresponsiveness. Initial CT confirms left parenchymal hemorrhage centered near the left thalamus with intraventricular blood, particularly in the left lateral ventricle.  Temporal horns are prominent and raise concern for developing hydrocephalus.    Stroke -  left thalamic parenchymal hemorrhage with IVH and prominent temporal horns  CT showed left subcortical ICH with IVH and possible hydrocephalus.  Carotid Doppler - no significant stenosis  2D Echo - EF 55-60%  no antithrombotics prior to admission, now on no antithrombotics secondary to hemorrhage.   SCDs for VTE prophylaxis  NPO  Bedrest  DNR  Palliative care met with son. They feel son is leaning towards comfort care. Await son's decision. He is meeting with palliative again at 1030am.    Dysphagia  Secondary to stroke  Hold panda placement given likely comfort care  Hypertension, Malignant   BP 203/104 on arrival. Cardene drip initially. Now off.  BP goal  < 160/90 BP 123-183/58-83 past 24h (03/10/2014 @ 9:51 AM)  Start oral meds via tube once placed Stable   Pneumonia, aspiration   on unasyn   vent per CCM   CXR improving  CBC trending down  Respiratory failure  Intubated, on vent per CCM  They will assess if a weaning candidate  Son will decide on trach later if needed  Other Stroke Risk Factors Advanced age  Other Active Problems  Leukocytosis, 13.2->16.7->13.3, repeat UA negative for UTI  Hypokalemia 3.7->3.3 ->3.6->2.7. Replaced with 4 runs this am  Breast cancer, not active   Hospital day # 4   SHARON BIBY, MSN, RN, ANVP-BC, ANP-BC, GNP-BC  [facility] Pager: [phone] 03/10/2014 9:51 AM   I, the attending vascular neurologist, have personally obtained a history, examined the patient, evaluated laboratory data, individually viewed imaging studies, and formulated the assessment and plan of care.  I have made any additions or clarifications directly to the above note and agree with the findings and plan as currently documented.   Rosalin Hawking, MD PhD Stroke Neurology 03/10/2014 1:18 PM  To contact Stroke Continuity provider, please refer to [email or web address] After hours, contact General Neurology

## 2014-03-10 NOTE — Progress Notes (Signed)
NUTRITION FOLLOW-UP  DOCUMENTATION CODES Per approved criteria  -Severe malnutrition in the context of chronic illness -Underweight   INTERVENTION:  If aggressive care and enteral access available:  Initiate Vital High Protein @ 20 ml/hr and increase by 10 ml every 4 hours to goal rate of 40 ml/hr. (already ordered)  Tube feeding regimen provides 960 kcal (107% of needs), 84 grams of protein, and 802 ml of H2O.   NUTRITION DIAGNOSIS: Inadequate oral intake related to inability to eat as evidenced by NPO status; ongoing.   Goal: Pt to meet >/= 90% of their estimated nutrition needs, not met.    Monitor:  TF tolerance, weight trends, labs  ASSESSMENT: Pt admitted on 9/26 with large left ICH. Pt intubated. Per MD prognosis very poor.   Per RN Palliative care meeting this am and family leaning towards comfort care.   Patient is currently intubated on ventilator support MV: 5.3 L/min Temp (24hrs), Avg:98.8 F (37.1 C), Min:97.5 F (36.4 C), Max:99.1 F (37.3 C)  Potassium and Phosphorus low on IV replacement.   Height: Ht Readings from Last 1 Encounters:  03/06/14 5' (1.524 m)    Weight: Wt Readings from Last 1 Encounters:  03/10/14 97 lb (44 kg)    BMI:  Body mass index is 18.94 kg/(m^2).  Estimated Nutritional Needs: Kcal: 900 Protein: >/= 63 grams Fluid: > 1.5 L/day  Skin: intact  Diet Order: NPO   Intake/Output Summary (Last 24 hours) at 03/10/14 0906 Last data filed at 03/10/14 0840  Gross per 24 hour  Intake   2025 ml  Output   1560 ml  Net    465 ml    Last BM: 9/27 smear   Labs:   Recent Labs Lab 03/06/14 1210 03/08/14 0245 03/09/14 1140 03/10/14 0357  NA 138 137 139 142  K 3.7 3.3* 3.6* 2.7*  CL 97 102 103 106  CO2 _0 BUN _1 CREATININE 0.51 0.42* 0.38* 0.45*  CALCIUM 9.4 9.1 8.6 8.8  MG  --  1.9  --  2.0  PHOS  --  1.9*  --  2.0*  GLUCOSE 158* 159* 104* 113*    CBG (last 3)   Recent Labs   03/09/14 2352 03/10/14 0322 03/10/14 0818  GLUCAP 104* 104* 102*    Scheduled Meds: . ampicillin-sulbactam (UNASYN) IV  1.5 g Intravenous Q8H  . antiseptic oral rinse  7 mL Mouth Rinse QID  . chlorhexidine  15 mL Mouth Rinse BID  . pantoprazole (PROTONIX) IV  40 mg Intravenous Daily  . potassium chloride  10 mEq Intravenous Q1 Hr x 4  . potassium phosphate IVPB (mmol)  10 mmol Intravenous Once  . senna-docusate  1 tablet Oral BID    Continuous Infusions: . sodium chloride 75 mL/hr at 03/09/14 [place], [place], [place] Pager [phone] After Hours Pager

## 2014-03-10 NOTE — Procedures (Signed)
Extubation Procedure Note  Patient Details:   Name: Maureen Weiss DOB: 05/04/1921 MRN: [id number]   Airway Documentation:     Evaluation  O2 sats: stable throughout Complications: No apparent complications Patient did tolerate procedure well. Bilateral Breath Sounds: Clear;Diminished Suctioning: Oral;Airway No  Pt extubated to comfort care. Pt tolerated well with no complications. Pt on room air per comfort care guidelines. Pt unable to speak post extubation due to neurological impairment.   RN aware.   Laymond Purser M 03/10/2014, 11:07 AM

## 2014-03-10 NOTE — Clinical Social Work Note (Signed)
Clinical Social Work Department BRIEF PSYCHOSOCIAL ASSESSMENT 03/10/2014  Patient:  Maureen Weiss,Maureen Weiss     Account Number:  [id number]     Admit date:  03/06/2014  Clinical Social Worker:  Myles Lipps  Date/Time:  03/10/2014 11:00 AM  Referred by:  Physician  Date Referred:  03/10/2014 Referred for  Other - See comment   Other Referral:   Residential Hospice placement   Interview type:  Family Other interview type:   Spoke with patient, Maureen Weiss at bedside    PSYCHOSOCIAL DATA Living Status:  ALONE Admitted from facility:   Level of care:   Primary support name:  Maureen Weiss, Maureen Weiss  [phone] Primary support relationship to patient:  CHILD, ADULT Degree of support available:   Strong    CURRENT CONCERNS Current Concerns  Post-Acute Placement   Other Concerns:   [place] / PLAN Clinical Social Worker met with patient son at bedside to offer support and discuss potential discharge needs. Patient son very realistically states, "my mom is going to die.  I just want her comfortable, whether that is here or [facility]."  Through tears, patient son states that he was at [facility] 5 years ago with his wife who was dying of Ovarian Cancer.  Patient son has a strong connection with the building and the staff and is hopeful that his mother will be able to transition.  CSW initiated referral to Harmon Pier with [facility] who will review information and follow up regarding patient eligibility.  CSW updated patient son at bedside and remains available for support and to facilitate patient discharge needs.   Assessment/plan status:  Psychosocial Support/Ongoing Assessment of Needs Other assessment/ plan:   Information/referral to community resources:   Clinical Social Worker made a referral to [facility] with MD recommendation and son request.    PATIENT'S/FAMILY'S RESPONSE TO PLAN OF CARE: Patient extubated this morning as a terminal extubation. Due  to neurological impairment patient is not verbalizing at this time.  Patient son is tearful at bedside but most definitely carrying out patient wishes.  Patient son is appropriate and realistic regarding patient potential to stablize and abiltiy to transition out of the hospital. Patient son is agreeable with residential hospice, specific to [facility].  Patient son verbalized understanding of CSW role and appreciation of support and involvement.

## 2014-03-10 NOTE — Progress Notes (Signed)
CRITICAL VALUE ALERT  Critical value received:  K: 2.7  Date of notification:  03/10/2014  Time of notification:  0507  Critical value read back:Yes.    Nurse who received alert:  Eliane Decree, RN   MD notified (1st page):  Dr. Alva Garnet  Time of first page:  0508  MD notified (2nd page):  Time of second page:  Responding MD:  Dr. Alva Garnet  Time MD responded:  [phone] Discussed course of care and possible withdrawal of care. Orders received. Will continue to monitor closely.

## 2014-03-10 NOTE — Progress Notes (Signed)
PULMONARY / CRITICAL CARE MEDICINE   Name: Maureen Weiss MRN: [id number] DOB: 01-Apr-1921    ADMISSION DATE:  03/06/2014 CONSULTATION DATE:  9/26   REFERRING MD :  Doy Mince   CHIEF COMPLAINT:  ICH and respiratory failure  INITIAL PRESENTATION:  80 year old female admitted on 9/26 w/ large L ICH. Intubated as not able to protect airway. PCCM asked to see for a/w vent and medical management.   STUDIES:  CT head 9/26: left thalamic hemorrhage with extension into the ventricles  SIGNIFICANT EVENTS: 9/26: confirmed DNR w/ pt's son  03/08/14: Unresponsive on vent - gcs 4. Gag +. Moves left upper arm.  03/09/14: A bit more awake. Neuro in family discussion now: concern son might want full code    SUBJECTIVE/OVERNIGHT/INTERVAL HX 03/10/14: Awake and following commands on left side. RASS +1. Possible terminal wean today with pallaitive care  VITAL SIGNS: Temp:  [97.5 F (36.4 C)-99.1 F (37.3 C)] 98.8 F (37.1 C) (09/30 0810) Pulse Rate:  [63-100] 100 (09/30 0810) Resp:  [13-34] 18 (09/30 0810) BP: (123-183)/(55-83) 160/71 mmHg (09/30 0810) SpO2:  [97 %-100 %] 99 % (09/30 0810) FiO2 (%):  [40 %] 40 % (09/30 0718) Weight:  [44 kg (97 lb)] 44 kg (97 lb) (09/30 0321) HEMODYNAMICS:   VENTILATOR SETTINGS: Vent Mode:  [-] PRVC FiO2 (%):  [40 %] 40 % Set Rate:  [14 bmp] 14 bmp Vt Set:  [400 mL] 400 mL PEEP:  [5 cmH20] 5 cmH20 Plateau Pressure:  [12 cmH20-15 cmH20] 13 cmH20 INTAKE / OUTPUT:  Intake/Output Summary (Last 24 hours) at 03/10/14 0828 Last data filed at 03/10/14 9628  Gross per 24 hour  Intake   1750 ml  Output   1560 ml  Net    190 ml   PHYSICAL EXAMINATION: General:  Unresponsive.  Neuro: 03/10/14: awake, RASS +1. Follows commands on left. Paralyzed on right HEENT:  Poor dentition orally intubated. PERRL  Cardiovascular:  rrr Lungs:  CT A bilaterally Abdomen:  Soft, + bowel sounds  Musculoskeletal:  Intact  Skin:  Intact   LABS:  CBC  Recent Labs Lab  03/08/14 0245 03/09/14 1140 03/10/14 0357  WBC 16.7* 13.3* 13.3*  HGB 13.5 13.3 12.7  HCT 41.2 39.5 39.2  PLT 282 277 301   Coag's No results found for this basename: APTT, INR,  in the last 168 hours BMET  Recent Labs Lab 03/08/14 0245 03/09/14 1140 03/10/14 0357  NA 137 139 142  K 3.3* 3.6* 2.7*  CL 102 103 106  CO2 21 22 23   BUN 13 11 16   CREATININE 0.42* 0.38* 0.45*  GLUCOSE 159* 104* 113*   Electrolytes  Recent Labs Lab 03/08/14 0245 03/09/14 1140 03/10/14 0357  CALCIUM 9.1 8.6 8.8  MG 1.9  --  2.0  PHOS 1.9*  --  2.0*   Sepsis Markers  Recent Labs Lab 03/06/14 1230  LATICACIDVEN 3.44*   ABG  Recent Labs Lab 03/06/14 1223 03/08/14 0251  PHART 7.460* 7.499*  PCO2ART 36.2 30.3*  PO2ART 125.0* 174.0*   Liver Enzymes  Recent Labs Lab 03/06/14 1210  AST 30  ALT 19  ALKPHOS 67  BILITOT 0.4  ALBUMIN 3.9   Cardiac Enzymes No results found for this basename: TROPONINI, PROBNP,  in the last 168 hours Glucose  Recent Labs Lab 03/09/14 1141 03/09/14 1645 03/09/14 2023 03/09/14 2352 03/10/14 0322 03/10/14 0818  GLUCAP 107* 100* 116* 104* 104* 102*    Imaging No results found. ASSESSMENT / PLAN:  PULMONARY OETT 9/26 A: acute resp failure      Possible aspiration pna    - awake enough to do SBT  P:   TRy SBT 03/10/14 Full vent support Empiric abx see ID section  See neuro section   CARDIOVASCULAR CVL A:  htn    - off cardene P:   goal bp 130-150   RENAL A:   Normal creat but low K and low phos P:   Replete k and phos Avoid hypotension Renal dose meds  F/u chem in am   GASTROINTESTINAL A:   No acute  P:   SUP w/ PPI  TF start 03/10/14 if full code and full medica care  HEMATOLOGIC A:  No acute  P:  PAS due to [place] Trend CBC   INFECTIOUS A:  prob aspiration  P:   Sputum 9/26>>> Abx: unasyn  start date 9/26,   ENDOCRINE A:   Hyperglycemia  P:   Trend glucose  SSI if glucose > 150 X 2 on accu  checks   NEUROLOGIC A:   Acute encephalopathy/ COMA in setting of ICH , GCS 4 at admit   - smuch improved 03/10/14 P:   RASS goal: 0 Supportive care  Carotid dopplers  BP control w/ cardene   TODAY'S SUMMARY: 80 year old female who has had a devastating ICH. She is unresponsive and on vent. Prognosis is very poor. Family wants to cont supportive care but does not want to prolong things if hope for meaningful recovery is poor as of 03/06/14.  On 03/10/14: awake and following commands. Will attempt conventional SBT to help prognosticate for son in anticipation of possible terminal wean. Palliative care discussing with son   The patient is critically ill with multiple organ systems failure and requires high complexity decision making for assessment and support, frequent evaluation and titration of therapies, application of advanced monitoring technologies and extensive interpretation of multiple databases.   Critical Care Time devoted to patient care services described in this note is 30 Minutes.    Dr. Brand Males, M.D., [facility].C.P Pulmonary and Critical Care Medicine Staff Physician [place] [facility] Pager: [phone], If no answer or between  15:00h - 7:00h: call [phone]  03/10/2014 8:28 AM

## 2014-03-10 NOTE — Progress Notes (Signed)
Progress Note from the Palliative Medicine Team at [facility]:  -patient remains intubated, opens eyes but unable to follow commands  -continued conversation regarding GOC, treatment option decsions, anticipatory care needs with Richardson Landry patient's son  -he verbalizes a clear understanding of the current medical situation and he hopes for comfort, quality and dignity for his mother --liberate from ventilator, no further intubation --medications for symptom management --no artificial feeding or hydration     Objective: Allergies  Allergen Reactions  . Neosporin [Neomycin-Polymyxin-Gramicidin]     Unknown  . Sulfa Antibiotics     Unknown   Scheduled Meds: . ampicillin-sulbactam (UNASYN) IV  1.5 g Intravenous Q8H  . antiseptic oral rinse  7 mL Mouth Rinse QID  . chlorhexidine  15 mL Mouth Rinse BID  . pantoprazole (PROTONIX) IV  40 mg Intravenous Daily  . potassium chloride  10 mEq Intravenous Q1 Hr x 4  . potassium phosphate IVPB (mmol)  10 mmol Intravenous Once  . senna-docusate  1 tablet Oral BID   Continuous Infusions: . sodium chloride 75 mL/hr at 03/09/14 2243   PRN Meds:.acetaminophen, acetaminophen, albuterol, fentaNYL, hydrALAZINE  BP 129/54  Pulse 82  Temp(Src) 98.8 F (37.1 C) (Core (Comment))  Resp 17  Ht 5' (1.524 m)  Wt 44 kg (97 lb)  BMI 18.94 kg/m2  SpO2 98%   PPS:20 %     Intake/Output Summary (Last 24 hours) at 03/10/14 1018 Last data filed at 03/10/14 0840  Gross per 24 hour  Intake   1950 ml  Output   1460 ml  Net    490 ml       Physical Exam:  General: post extubation chronically ill appearing ,NAD HEENT:  moist buccal membranes, no exudate Chest:   Decreased in bases, CTA CVS: RRR Abdomen:soft NT +BS Ext: RUE bruised and puffy, BLE with edema Neuro: non verbal, unable to follow commands  Labs: CBC    Component Value Date/Time   WBC 13.3* 03/10/2014 0357   WBC 6.9 08/28/2007 0842   RBC 4.19 03/10/2014 0357   RBC 3.35*  08/28/2007 0842   HGB 12.7 03/10/2014 0357   HGB 9.8* 08/28/2007 0842   HCT 39.2 03/10/2014 0357   HCT 28.8* 08/28/2007 0842   PLT 301 03/10/2014 0357   PLT 422* 08/28/2007 0842   MCV 93.6 03/10/2014 0357   MCV 85.7 08/28/2007 0842   MCH 30.3 03/10/2014 0357   MCH 29.2 08/28/2007 0842   MCHC 32.4 03/10/2014 0357   MCHC 34.0 08/28/2007 0842   RDW 14.2 03/10/2014 0357   RDW 13.3 08/28/2007 0842   LYMPHSABS 0.9 03/10/2014 0357   LYMPHSABS 1.2 08/28/2007 0842   MONOABS 1.4* 03/10/2014 0357   MONOABS 0.6 08/28/2007 0842   EOSABS 0.1 03/10/2014 0357   EOSABS 0.3 08/28/2007 0842   BASOSABS 0.0 03/10/2014 0357   BASOSABS 0.0 08/28/2007 0842    BMET    Component Value Date/Time   NA 142 03/10/2014 0357   K 2.7* 03/10/2014 0357   CL 106 03/10/2014 0357   CO2 23 03/10/2014 0357   GLUCOSE 113* 03/10/2014 0357   BUN 16 03/10/2014 0357   CREATININE 0.45* 03/10/2014 0357   CALCIUM 8.8 03/10/2014 0357   GFRNONAA 86* 03/10/2014 0357   GFRAA >90 03/10/2014 0357    CMP     Component Value Date/Time   NA 142 03/10/2014 0357   K 2.7* 03/10/2014 0357   CL 106 03/10/2014 0357   CO2 23 03/10/2014 0357  GLUCOSE 113* 03/10/2014 0357   BUN 16 03/10/2014 0357   CREATININE 0.45* 03/10/2014 0357   CALCIUM 8.8 03/10/2014 0357   PROT 7.5 03/06/2014 1210   ALBUMIN 3.9 03/06/2014 1210   AST 30 03/06/2014 1210   ALT 19 03/06/2014 1210   ALKPHOS 67 03/06/2014 1210   BILITOT 0.4 03/06/2014 1210   GFRNONAA 86* 03/10/2014 0357   GFRAA >90 03/10/2014 0357    Assessment and Plan: 1. Code Status:  DNR/DNI-comfort is main focus of care 2. Symptom Control: Pain/Dyspnea:  Morphine 2 mg IV every 1 hr prn Agitation: Ativan 1 mg IV every 4 hrs prn 3. Psycho/Social:  Emotional support offered to family.  Discussed natural trajectory and expectations at EOL.  We spoke to "watchful waiting" over the next 24 hrs for next steps and anticipatory care needs.  Will f/u in am 4. Disposition:  Dependant on options.  May transition to [facility])  if patient stabilizes over the next 4-6 hrs.  Evaluate in am for residential hospice.  Patient Documents Completed or Given: Document Given Completed  Advanced Directives Pkt    MOST yes   DNR  yes  Gone from My [facility]ight    Hard Choices yes     Time In Time Out Total Time Spent with Patient Total Overall Time  1030 1115 35 min 45 min    Greater than 50%  of this time was spent counseling and coordinating care related to the above assessment and plan.  Wadie Lessen NP  Palliative Medicine Team Team Phone # [phone] Pager [phone]  Discussed with Burnetta Sabin NP 1

## 2014-03-10 NOTE — Progress Notes (Signed)
Chaplain responded to a referral from palliative care. Pt son present but "making notes for meeting." Chaplain will follow up after meeting.  03/10/14 1000  Clinical Encounter Type  Visited With Family  Visit Type Follow-up  Referral From Palliative care team  Marcelino Scot 03/10/2014 10:07 AM

## 2014-03-11 ENCOUNTER — Inpatient Hospital Stay (HOSPITAL_COMMUNITY)
Admission: AD | Admit: 2014-03-11 | Discharge: 2014-04-11 | DRG: 065 | Disposition: E | Source: Ambulatory Visit | Attending: Internal Medicine | Admitting: Internal Medicine

## 2014-03-11 DIAGNOSIS — I10 Essential (primary) hypertension: Secondary | ICD-10-CM

## 2014-03-11 DIAGNOSIS — R0600 Dyspnea, unspecified: Secondary | ICD-10-CM | POA: Diagnosis present

## 2014-03-11 DIAGNOSIS — Z515 Encounter for palliative care: Secondary | ICD-10-CM

## 2014-03-11 DIAGNOSIS — R4020 Unspecified coma: Secondary | ICD-10-CM | POA: Diagnosis present

## 2014-03-11 DIAGNOSIS — I619 Nontraumatic intracerebral hemorrhage, unspecified: Secondary | ICD-10-CM | POA: Diagnosis present

## 2014-03-11 DIAGNOSIS — K117 Disturbances of salivary secretion: Secondary | ICD-10-CM

## 2014-03-11 DIAGNOSIS — Z66 Do not resuscitate: Secondary | ICD-10-CM | POA: Diagnosis present

## 2014-03-11 DIAGNOSIS — F419 Anxiety disorder, unspecified: Secondary | ICD-10-CM | POA: Diagnosis present

## 2014-03-11 DIAGNOSIS — C189 Malignant neoplasm of colon, unspecified: Secondary | ICD-10-CM | POA: Diagnosis present

## 2014-03-11 DIAGNOSIS — Z881 Allergy status to other antibiotic agents status: Secondary | ICD-10-CM | POA: Diagnosis not present

## 2014-03-11 DIAGNOSIS — I615 Nontraumatic intracerebral hemorrhage, intraventricular: Principal | ICD-10-CM

## 2014-03-11 DIAGNOSIS — R40243 Glasgow coma scale score 3-8, unspecified time: Secondary | ICD-10-CM

## 2014-03-11 DIAGNOSIS — M1990 Unspecified osteoarthritis, unspecified site: Secondary | ICD-10-CM | POA: Diagnosis present

## 2014-03-11 DIAGNOSIS — Z883 Allergy status to other anti-infective agents status: Secondary | ICD-10-CM

## 2014-03-11 DIAGNOSIS — I629 Nontraumatic intracranial hemorrhage, unspecified: Secondary | ICD-10-CM

## 2014-03-11 DIAGNOSIS — J9601 Acute respiratory failure with hypoxia: Secondary | ICD-10-CM

## 2014-03-11 MED ORDER — MORPHINE SULFATE 2 MG/ML IJ SOLN
2.0000 mg | INTRAMUSCULAR | Status: DC | PRN
  Administered 2014-03-12: 2 mg via INTRAVENOUS
  Filled 2014-03-11: qty 1

## 2014-03-11 MED ORDER — MORPHINE SULFATE 2 MG/ML IJ SOLN: 2.0000 mg | INTRAMUSCULAR | Status: DC | PRN

## 2014-03-11 MED ORDER — ACETAMINOPHEN 650 MG RE SUPP
650.0000 mg | RECTAL | Status: DC | PRN
Start: 2014-03-11 — End: 2014-03-13
  Administered 2014-03-12: 650 mg via RECTAL
  Filled 2014-03-11: qty 1

## 2014-03-11 MED ORDER — ATROPINE SULFATE 1 % OP SOLN
4.0000 [drp] | Freq: Four times a day (QID) | OPHTHALMIC | Status: DC
  Administered 2014-03-11 – 2014-03-12 (×5): 4 [drp] via SUBLINGUAL
  Administered 2014-03-12: 09:00:00 via SUBLINGUAL
  Filled 2014-03-11: qty 2

## 2014-03-11 MED ORDER — CETYLPYRIDINIUM CHLORIDE 0.05 % MT LIQD
7.0000 mL | Freq: Four times a day (QID) | OROMUCOSAL | Status: DC
  Administered 2014-03-11 – 2014-03-13 (×6): 7 mL via OROMUCOSAL

## 2014-03-11 MED ORDER — CHLORHEXIDINE GLUCONATE 0.12 % MT SOLN
15.0000 mL | Freq: Two times a day (BID) | OROMUCOSAL | Status: DC
  Administered 2014-03-11 – 2014-03-12 (×3): 15 mL via OROMUCOSAL
  Filled 2014-03-11 (×3): qty 15

## 2014-03-11 MED ORDER — ALBUTEROL SULFATE (2.5 MG/3ML) 0.083% IN NEBU
2.5000 mg | INHALATION_SOLUTION | RESPIRATORY_TRACT | Status: DC | PRN
Start: 2014-03-11 — End: 2014-03-13

## 2014-03-11 MED ORDER — LORAZEPAM 2 MG/ML IJ SOLN
1.0000 mg | INTRAMUSCULAR | Status: DC
  Administered 2014-03-11 – 2014-03-12 (×9): 1 mg via INTRAVENOUS
  Filled 2014-03-11 (×7): qty 1

## 2014-03-11 MED ORDER — LORAZEPAM 2 MG/ML IJ SOLN: 0.5000 mg | INTRAMUSCULAR | Status: DC | PRN

## 2014-03-11 MED ORDER — ATROPINE SULFATE 1 % OP SOLN
4.0000 [drp] | OPHTHALMIC | Status: DC | PRN
  Filled 2014-03-11: qty 2

## 2014-03-11 MED ORDER — SODIUM CHLORIDE 0.9 % IV SOLN: INTRAVENOUS | Status: DC

## 2014-03-11 MED ORDER — SCOPOLAMINE 1 MG/3DAYS TD PT72
1.0000 | MEDICATED_PATCH | TRANSDERMAL | Status: DC
  Administered 2014-03-11: 1.5 mg via TRANSDERMAL
  Filled 2014-03-11: qty 1

## 2014-03-11 MED ORDER — SCOPOLAMINE 1 MG/3DAYS TD PT72: 1.0000 | MEDICATED_PATCH | TRANSDERMAL | Status: DC

## 2014-03-11 MED ORDER — LORAZEPAM 2 MG/ML IJ SOLN
0.5000 mg | Freq: Four times a day (QID) | INTRAMUSCULAR | Status: DC | PRN
  Filled 2014-03-11 (×2): qty 1

## 2014-03-11 NOTE — Progress Notes (Signed)
Nutrition Brief Note  Chart reviewed. Pt now transitioning to comfort care.  No further nutrition interventions warranted at this time.  Please re-consult as needed.   Grayson Pfefferle RD, LDN, CNSC [phone] Pager [phone] After Hours Pager    

## 2014-03-11 NOTE — Care Management Note (Signed)
  Page 1 of 1   04/04/2014     11:19:53 AM CARE MANAGEMENT NOTE 04/10/2014  Patient:  Maureen Weiss,Maureen Weiss   Account Number:  [id number]  Date Initiated:  04/10/2014  Documentation initiated by:  Magdalen Spatz  Subjective/Objective Assessment:     Action/Plan:   Anticipated DC Date:     Anticipated DC Plan:           Choice offered to / List presented to:             Status of service:   Medicare Important Message given?   (If response is "NO", the following Medicare IM given date fields will be blank) Date Medicare IM given:   Medicare IM given by:   Date Additional Medicare IM given:   Additional Medicare IM given by:    Discharge Disposition:    Per UR Regulation:    If discussed at Long Length of Stay Meetings, dates discussed:   04/05/2014    Comments:  03/18/2014  Referral for GIP . Spoke with patient's son Maureen Weiss ( [phone] ) at bedside. Explained GIP , Mr Capers voiced understanding and is in agreement .  Provided choice to son , chose Hospice and [facility] . Referral given to Santiago Glad at [facility] . Confirmed with Burnetta Sabin that Palliative Medicine to be attending . Magdalen Spatz RN BSN [phone]

## 2014-03-11 NOTE — Clinical Social Work Note (Signed)
Clinical Social Worker continuing to follow patient and family for support and potential discharge planning needs.  CSW spoke with patient son at bedside who received notification that patient is not stable enough for transfer to [facility].  CSW spoke with Wadie Lessen from Palliative who is going to initiate conversation with patient family regarding GIP status.  Patient family is agreeable with GIP and patient transition to the medical floor.  CM working with [place] to arrange GIP status.  Patient son is coping appropriately and has all arrangements already lined up.    Clinical Social Worker will sign off for now as social work intervention is no longer needed. Please consult [place] again if new need arises.  Barbette Or, [place]

## 2014-03-11 NOTE — Plan of Care (Signed)
Problem: Discharge/Transitional Outcomes Goal: Independent mobility/functioning independent or with min Independent mobility/functioning independently or with minimal assistance  Outcome: Not Met (add Reason) Comfort care with Hospice

## 2014-03-11 NOTE — Plan of Care (Signed)
Problem: Acute Treatment Outcomes Goal: Neuro exam at baseline or improved Variance: Did not return to baseline function

## 2014-03-11 NOTE — Progress Notes (Signed)
Chaplain initiated follow up with pt and family. Chaplain asked pt son about emotional needs and pt son thought he was "doing pretty well." Chaplain reminded pt son about spiritual care services.   04/07/2014 1100  Clinical Encounter Type  Visited With Patient and family together  Visit Type Follow-up;Spiritual support  Spiritual Encounters  Spiritual Needs Emotional;Grief support  Marcelino Scot 04/06/2014 11:47 AM

## 2014-03-11 NOTE — Progress Notes (Signed)
STROKE TEAM PROGRESS NOTE   HISTORY Maureen Weiss is an 80 y.o. female who lives alone and is able to perform all of her ADL's. Was fine when she talked to her son at 55 to tell him that the power was out. When he called to check on her later in the day she did not answer. When he went to the house she was in the bed and unresponsive. EMS was called at that time and he attempted to start chest compressions prior to their arrival. [name] was brought in for evaluation and was not able to protect her airway. She required intubation at that time. It is unclear if the patient was taking her antihypertensives at home. She was last known well 03/06/2014 at 03:30. She was not a tPA candidate given ICH.atie   SUBJECTIVE (INTERVAL HISTORY) Her son is at the bedside. Due to respiratory status, pt not a candidate for transfer to [facility]. Palliative care NP has spoken with pts son.   OBJECTIVE Temp:  [98.7 F (37.1 C)-101.5 F (38.6 C)] 100.6 F (38.1 C) (10/01 1000) Pulse Rate:  [51-124] 108 (10/01 1000) Cardiac Rhythm:  [-] Normal sinus rhythm (09/30 2015) Resp:  [18-61] 18 (10/01 1000) BP: (140-185)/(71-101) 140/71 mmHg (10/01 0633) SpO2:  [57 %-96 %] 88 % (10/01 1000)  CBG (last 3)   Recent Labs  03/09/14 2352 03/10/14 0322 03/10/14 0818  GLUCAP 104* 104* 102*   BMET    Component Value Date/Time   NA 142 03/10/2014 0357   K 2.7* 03/10/2014 0357   CL 106 03/10/2014 0357   CO2 23 03/10/2014 0357   GLUCOSE 113* 03/10/2014 0357   BUN 16 03/10/2014 0357   CREATININE 0.45* 03/10/2014 0357   CALCIUM 8.8 03/10/2014 0357   GFRNONAA 86* 03/10/2014 0357   GFRAA >90 03/10/2014 0357   CBC    Component Value Date/Time   WBC 13.3* 03/10/2014 0357   WBC 6.9 08/28/2007 0842   RBC 4.19 03/10/2014 0357   RBC 3.35* 08/28/2007 0842   HGB 12.7 03/10/2014 0357   HGB 9.8* 08/28/2007 0842   HCT 39.2 03/10/2014 0357   HCT 28.8* 08/28/2007 0842   PLT 301 03/10/2014 0357   PLT 422* 08/28/2007 0842   MCV 93.6  03/10/2014 0357   MCV 85.7 08/28/2007 0842   MCH 30.3 03/10/2014 0357   MCH 29.2 08/28/2007 0842   MCHC 32.4 03/10/2014 0357   MCHC 34.0 08/28/2007 0842   RDW 14.2 03/10/2014 0357   RDW 13.3 08/28/2007 0842   LYMPHSABS 0.9 03/10/2014 0357   LYMPHSABS 1.2 08/28/2007 0842   MONOABS 1.4* 03/10/2014 0357   MONOABS 0.6 08/28/2007 0842   EOSABS 0.1 03/10/2014 0357   EOSABS 0.3 08/28/2007 0842   BASOSABS 0.0 03/10/2014 0357   BASOSABS 0.0 08/28/2007 0842    Ct Head Wo Contrast 03/06/2014    Left parenchymal hemorrhage centered near the left thalamus. There is a large amount of intraventricular blood, particularly in the left lateral ventricle.  Temporal horns are prominent and raise concern for developing hydrocephalus.  Evidence for chronic small vessel ischemic changes.  Chronic right maxillary sinus disease.    Dg Chest Portable 1 View 03/08/2014 Stable. Hyperexpansion without acute cardiopulmonary findings. 03/07/2014 Limited assessment of lung fields due to over penetration.  No definite acute abnormalities.  03/06/2014    1. Atypical course of the nasogastric tube raising the question of 2 within the left mainstem bronchus and left lower lobe of the lung versus hiatal hernia.  03/06/2014    1. Endotracheal tube in good anatomic position. NG tube tip noted projected over the left chest. Although this may be in a hiatal hernia, repositioning is suggested.  2. Infiltrate right lung base cannot be excluded.      Carotid Doppler  No evidence of hemodynamically significant internal carotid artery stenosis. Vertebral artery flow is antegrade.   2D Echocardiogram  - Mild LVH with LVEF 43-15%, grade 1 diastolic dysfunction. Sclerotic aortic valve with mild aortic regurgitation. Mild mitral and tricuspid regugitation. Normal PASP 23 mmHg. Hypermobile interatrial septum without obvious PFO. Agitated saline study could provide further assessment.   Abd xray 03/08/2014  Feeding catheter is noted to be coiled  within a hiatal hernia. Reattempt and reimaging is recommended.   PHYSICAL EXAM Temp:  [98.7 F (37.1 C)-101.5 F (38.6 C)] 100.6 F (38.1 C) (10/01 1000) Pulse Rate:  [51-124] 108 (10/01 1000) Resp:  [18-61] 18 (10/01 1000) BP: (140-185)/(71-101) 140/71 mmHg (10/01 0633) SpO2:  [57 %-96 %] 88 % (10/01 1000)  General - Well nourished, well developed, intubated but not on sedation.  Ophthalmologic - not able to see through.  Cardiovascular - Regular rate and rhythm with no murmur.  Neuro - intubated but open eyes on voice, did not follow commands. PERRL with right surgical pupil, doll's eye present, corneal and gag positive. Localized to pain on LUE and withdraw to pain on LLE. 0/5 RUE and trace withdraw to pain on RLE. Reflex 1+ and left babinski positive and right mute.   ASSESSMENT/PLAN [name] is a 80 y.o. female with history of hypertension and colon cancer presenting with unresponsiveness. Initial CT confirms left parenchymal hemorrhage centered near the left thalamus with intraventricular blood, particularly in the left lateral ventricle.  Temporal horns are prominent and raise concern for developing hydrocephalus. Neurologic prognosis is poor.   Stroke -  left thalamic parenchymal hemorrhage with IVH and prominent temporal horns  CT showed left subcortical ICH with IVH and possible hydrocephalus.  Carotid Doppler - no significant stenosis  2D Echo - EF 55-60%  no antithrombotics prior to admission, now on no antithrombotics secondary to hemorrhage.   SCDs for VTE prophylaxis  NPO  Bedrest  DNR  Son has decided on comfort care  Terminal wean 03/10/2014  [facility] bed available 04/07/2014 however, patient too unstable for transfer. Will xfer out of ICU and ask for GIP program with Hospice to evaluate.   Dysphagia  Secondary to stroke  Hypertension, Malignant   BP 203/104 on arrival. Cardene drip initially. Now off.  Pneumonia,  aspiration  Placed on unasyn  Placed on vent, managed by CCM  Terminal wean 03/10/2014  Respiratory failure  Intubated, on vent per CCM  Terminal wean 03/10/2014  Other Stroke Risk Factors Advanced age  Other Active Problems  Leukocytosis, 13.2->16.7->13.3, repeat UA negative for UTI  Hypokalemia 3.7->3.3 ->3.6->2.7. Replaced  Breast cancer, not active   Hospital day # 5   [facility] BIBY, MSN, RN, ANVP-BC, ANP-BC, [facility] Pager: [phone] 03/20/2014 10:27 AM   I, the attending vascular neurologist, have personally obtained a history, examined the patient, evaluated laboratory data, individually viewed imaging studies, and formulated the assessment and plan of care.  I have made any additions or clarifications directly to the above note and agree with the findings and plan as currently documented.    Rosalin Hawking, MD PhD Stroke Neurology 03/12/2014 9:43 PM    To contact Stroke Continuity provider, please refer to [email or web address]  After hours, contact General Neurology

## 2014-03-11 NOTE — Discharge Summary (Signed)
Stroke Discharge Summary  Patient ID: Maureen Weiss   MRN: [id number]      DOB: 1921/05/10  Date of Admission: 03/06/2014 Date of Discharge: 03/20/2014  Attending Physician:  Rosalin Hawking, MD, Stroke MD  Consulting Physician(s):  Treatment Team:  Palliative Doy Hutching, MD (pulmonary/intensive care), Billey Chang, MD (Palliative care)  Patient's PCP:  Irven Shelling, MD  Discharge Diagnoses:  Stroke - left thalamic parenchymal hemorrhage with IVH and hydrocephalus Dysphagia, Secondary to stroke Hypertension, Malignant  Coma, secondary to stroke Acute Respiratory failure with hypoxia Pneumonia, aspiration  Leukocytosis Protein-calorie malnutrition, severe DNR (do not resuscitate) Pain, generalized  BMI  Body mass index is 18.94 kg/(m^2).  Past Medical History  Diagnosis Date  . Hypertension   . Osteoarthritis   . Colon cancer    Past Surgical History  Procedure Laterality Date  . Colon surgery      Medications to be continued on Rehab . antiseptic oral rinse  7 mL Mouth Rinse QID  . chlorhexidine  15 mL Mouth Rinse BID  . LORazepam  1 mg Intravenous Q4H  . scopolamine  1 patch Transdermal Q72H    LABORATORY STUDIES CBC    Component Value Date/Time   WBC 13.3* 03/10/2014 0357   WBC 6.9 08/28/2007 0842   RBC 4.19 03/10/2014 0357   RBC 3.35* 08/28/2007 0842   HGB 12.7 03/10/2014 0357   HGB 9.8* 08/28/2007 0842   HCT 39.2 03/10/2014 0357   HCT 28.8* 08/28/2007 0842   PLT 301 03/10/2014 0357   PLT 422* 08/28/2007 0842   MCV 93.6 03/10/2014 0357   MCV 85.7 08/28/2007 0842   MCH 30.3 03/10/2014 0357   MCH 29.2 08/28/2007 0842   MCHC 32.4 03/10/2014 0357   MCHC 34.0 08/28/2007 0842   RDW 14.2 03/10/2014 0357   RDW 13.3 08/28/2007 0842   LYMPHSABS 0.9 03/10/2014 0357   LYMPHSABS 1.2 08/28/2007 0842   MONOABS 1.4* 03/10/2014 0357   MONOABS 0.6 08/28/2007 0842   EOSABS 0.1 03/10/2014 0357   EOSABS 0.3 08/28/2007 0842   BASOSABS 0.0 03/10/2014 0357   BASOSABS 0.0 08/28/2007 0842   CMP    Component Value Date/Time   NA 142 03/10/2014 0357   K 2.7* 03/10/2014 0357   CL 106 03/10/2014 0357   CO2 23 03/10/2014 0357   GLUCOSE 113* 03/10/2014 0357   BUN 16 03/10/2014 0357   CREATININE 0.45* 03/10/2014 0357   CALCIUM 8.8 03/10/2014 0357   PROT 7.5 03/06/2014 1210   ALBUMIN 3.9 03/06/2014 1210   AST 30 03/06/2014 1210   ALT 19 03/06/2014 1210   ALKPHOS 67 03/06/2014 1210   BILITOT 0.4 03/06/2014 1210   GFRNONAA 86* 03/10/2014 0357   GFRAA >90 03/10/2014 0357   COAGS No results found for this basename: INR, PROTIME   Lipid Panel No results found for this basename: chol, trig, hdl, cholhdl, vldl, ldlcalc   HgbA1C  No results found for this basename: HGBA1C   Cardiac Panel (last 3 results) No results found for this basename: CKTOTAL, CKMB, TROPONINI, RELINDX,  in the last 72 hours Urinalysis    Component Value Date/Time   COLORURINE YELLOW 03/09/2014 0930   APPEARANCEUR CLEAR 03/09/2014 0930   LABSPEC 1.014 03/09/2014 0930   PHURINE 6.5 03/09/2014 0930   GLUCOSEU NEGATIVE 03/09/2014 0930   HGBUR TRACE* 03/09/2014 0930   BILIRUBINUR NEGATIVE 03/09/2014 0930   KETONESUR 15* 03/09/2014 0930   PROTEINUR NEGATIVE 03/09/2014 0930   UROBILINOGEN 0.2 03/09/2014 0930  NITRITE NEGATIVE 03/09/2014 0930   LEUKOCYTESUR NEGATIVE 03/09/2014 0930   Urine Drug Screen  No results found for this basename: labopia, cocainscrnur, labbenz, amphetmu, thcu, labbarb    Alcohol Level No results found for this basename: eth     SIGNIFICANT DIAGNOSTIC STUDIES Ct Head Wo Contrast 03/06/2014 Left parenchymal hemorrhage centered near the left thalamus. There is a large amount of intraventricular blood, particularly in the left lateral ventricle. Temporal horns are prominent and raise concern for developing hydrocephalus. Evidence for chronic small vessel ischemic changes. Chronic right maxillary sinus disease.  Dg Chest Portable 1 View  03/08/2014 Stable. Hyperexpansion  without acute cardiopulmonary findings.  03/07/2014 Limited assessment of lung fields due to over penetration. No definite acute abnormalities.  03/06/2014 1. Atypical course of the nasogastric tube raising the question of 2 within the left mainstem bronchus and left lower lobe of the lung versus hiatal hernia.  03/06/2014 1. Endotracheal tube in good anatomic position. NG tube tip noted projected over the left chest. Although this may be in a hiatal hernia, repositioning is suggested. 2. Infiltrate right lung base cannot be excluded.  Carotid Doppler No evidence of hemodynamically significant internal carotid artery stenosis. Vertebral artery flow is antegrade.  2D Echocardiogram - Mild LVH with LVEF 83-15%, grade 1 diastolic dysfunction. Sclerotic aortic valve with mild aortic regurgitation. Mild mitral and tricuspid regugitation. Normal PASP 23 mmHg. Hypermobile interatrial septum without obvious PFO. Agitated saline study could provide further assessment.  Abd xray 03/08/2014 Feeding catheter is noted to be coiled within a hiatal hernia. Reattempt and reimaging is recommended.     HISTORY OF PRESENT ILLNES Maureen Weiss is an 80 y.o. female who lives alone and is able to perform all of her ADL's. Was fine when she talked to her son at 84 to tell him that the power was out. When he called to check on her later in the day she did not answer. When he went to the house she was in the bed and unresponsive. EMS was called at that time and he attempted to start chest compressions prior to their arrival. [name] was brought in for evaluation and was not able to protect her airway. She required intubation at that time. It is unclear if the patient was taking her antihypertensives at home. She was last known well 03/06/2014 at 03:30. She was not a tPA candidate given ICH.[facility] COURSE [name] is a 80 y.o. female with history of hypertension and colon cancer presenting with unresponsiveness.  Initial CT confirms left parenchymal hemorrhage centered near the left thalamus with intraventricular blood, particularly in the left lateral ventricle. Temporal horns are prominent and raise concern for developing hydrocephalus. Neurologic prognosis is poor. Family has opted for comfort care. She is being transitioned to IP palliative care service, discharged from stroke and readmitted to them.   Stroke - left thalamic parenchymal hemorrhage with IVH and prominent temporal horns  CT showed left subcortical ICH with IVH and possible hydrocephalus.  Carotid Doppler - no significant stenosis  2D Echo - EF 55-60%  no antithrombotics prior to admission, now on no antithrombotics secondary to hemorrhage.  DNR, comfort care  Survived Terminal wean 03/10/2014  [facility] bed available 04/10/2014 however, patient too unstable for transfer. Will xfer out of ICU and ask for GIP with Hospice to admit.  Dysphagia  Secondary to stroke  Hypertension, Malignant  BP 203/104 on arrival. Cardene drip initially. Now off.  Respiratory failure  Pneumonia, aspiration  Placed on  unasyn  Placed on vent, managed by CCM  Terminal wean 03/10/2014  Other Stroke Risk Factors  Advanced age  Other Active Problems  Leukocytosis, 13.2->16.7->13.3, repeat UA negative for UTI  Hypokalemia 3.7->3.3 ->3.6->2.7. Replaced  Breast cancer, not active    DISCHARGE EXAM Blood pressure 140/71, pulse 110, temperature 100.8 F (38.2 C), temperature source Core (Comment), resp. rate 17, height 5' (1.524 m), weight 97 lb (44 kg), SpO2 89.00%.  General - Well nourished, well developed, intubated but not on sedation.  Ophthalmologic - not able to see through.  Cardiovascular - Regular rate and rhythm with no murmur.  Neuro - intubated but open eyes on voice, did not follow commands. PERRL with right surgical pupil, doll's eye present, corneal and gag positive. Localized to pain on LUE and withdraw to pain on LLE. 0/5 RUE and  trace withdraw to pain on RLE. Reflex 1+ and left babinski positive and right mute.   Discharge Diet  NPO   DISCHARGE PLAN  Disposition:  Discharge to IP Hospice GIP program  Comfort care  25 minutes were spent preparing discharge.  Burnetta Sabin, MSN, RN, ANVP-BC, ANP-BC, GNP-BC [facility] Pager: [phone] 03/15/2014 3:21 PM   I, the attending vascular neurologist, have personally obtained a history, examined the patient, evaluated laboratory data, individually viewed imaging studies, and formulated the assessment and plan of care.  I have made any additions or clarifications directly to the above note and agree with the findings and plan as currently documented.    Rosalin Hawking, MD PhD Stroke Neurology 03/15/2014 8:25 AM

## 2014-03-11 NOTE — H&P (Signed)
Palliative Medicine Team at [facility] (GIP) Admission Date: 04/05/2014   Patient Name: Maureen Weiss  DOB: 1920-11-14  MRN: [id number]  Age / Sex: 80 y.o., female   PCP: Irven Shelling, MD Referring Physician: Marcelle Smiling, MD  Active Problems: Active Problems:   Intracerebral hemorrhage   HPI/Reason for Consultation: [name] is a 80 y.o. Patient  With a past medical history for Colon Cancer, hypertension and osteoarthritis. She lived in her own home ten minutes up the road from her son and was fairly independent and active until this hospitalization. Her son called her earlier in the day to report power outage and when he recalled and she did not answer he went up to the house and found her unresponsive in the bed.  She was found to have a large intracranial hemorrhage.  Patient was intubated and treat Appropriately until it became apparent that she would likely not recover. Her son met with palliative care and decided on extubation to comfort.  I met with her son to review any questions regarding transition to General In Patient Hospice care.  Patient is comatose with increased oral secretions but no evidence for pain or increased work of breathing at this time.  Primary Contacts: HCPOA: Yes, Son Richardson Landry [phone]  Advance Directive: Yes   I have reviewed the medical record, interviewed the patient and family, and examined the patient. The following aspects are pertinent.  Past Medical History  Diagnosis Date  . Hypertension   . Osteoarthritis   . Colon cancer     History   Social History  . Marital Status: Widowed    Spouse Name: N/A    Number of Children: N/A  . Years of Education: N/A   Social History Main Topics  . Smoking status: Never Smoker   . Smokeless tobacco: Not on file  . Alcohol Use: No  . Drug Use: No  . Sexual Activity:    Other Topics Concern  . Not on file   Social History Narrative  . No narrative  on file     No family history on file. Scheduled Meds: . antiseptic oral rinse  7 mL Mouth Rinse QID  . atropine  4 drop Sublingual QID  . chlorhexidine  15 mL Mouth Rinse BID  . LORazepam  1 mg Intravenous Q4H  . [START ON 03/23/2014] scopolamine  1 patch Transdermal Q72H   Continuous Infusions: . sodium chloride     PRN Meds:.acetaminophen, albuterol, LORazepam, morphine injection Allergies  Allergen Reactions  . Neosporin [Neomycin-Polymyxin-Gramicidin]     Unknown  . Sulfa Antibiotics     Unknown    Labs and Data Reviewed:     Component Value Date/Time   WBC 13.3* 03/10/2014 0357   WBC 6.9 08/28/2007 0842   HGB 12.7 03/10/2014 0357   HGB 9.8* 08/28/2007 0842   HCT 39.2 03/10/2014 0357   HCT 28.8* 08/28/2007 0842   PLT 301 03/10/2014 0357   PLT 422* 08/28/2007 0842   MCV 93.6 03/10/2014 0357   MCV 85.7 08/28/2007 0842   NEUTROABS 10.9* 03/10/2014 0357   NEUTROABS 4.8 08/28/2007 0842   LYMPHSABS 0.9 03/10/2014 0357   LYMPHSABS 1.2 08/28/2007 0842   MONOABS 1.4* 03/10/2014 0357   MONOABS 0.6 08/28/2007 0842   EOSABS 0.1 03/10/2014 0357   EOSABS 0.3 08/28/2007 0842   BASOSABS 0.0 03/10/2014 0357   BASOSABS 0.0 08/28/2007 0842      Component Value Date/Time   NA 142 03/10/2014 0357  K 2.7* 03/10/2014 0357   CL 106 03/10/2014 0357   CO2 23 03/10/2014 0357   BUN 16 03/10/2014 0357   CREATININE 0.45* 03/10/2014 0357   GLUCOSE 113* 03/10/2014 0357   CALCIUM 8.8 03/10/2014 0357   AST 30 03/06/2014 1210   ALT 19 03/06/2014 1210   ALKPHOS 67 03/06/2014 1210   BILITOT 0.4 03/06/2014 1210   PROT 7.5 03/06/2014 1210   ALBUMIN 3.9 03/06/2014 1210    Vital Signs: 100.8 P: 110 R: 17  140/71  Sats 89 %   Physical Exam:  General Appearance:petite frail , no acute distress  Eyes:tearing, pupils not examined HEENT: mmm, copious secretions,  Lungs: decreased with some upper airway sounds CV: tachy, S1, S2 Abdomen: soft, no grimace Extremities: warm, no mottling Skin: thin, some lower  extremity bruising  Psych: comatose   Assessment/Prognosis:  Primary Diagnoses/Hospice Dx:  1. Intracranial Hemorrhage  Active Symptoms: 1. Terminal secretions: add atropine scheduled to scopalomine 2. Comatose- scheduled ativan in place for seizure prevention and anxiety 3. Respiratory failure: hypoxic, no increased work of breathing at present would have low threshold for continuous opiates. Not needed at present. Prn morphine available. 4. Fever: as needed tylenol.  Patient requires aggressive, intensive treatment to control pain and symptoms. Care can not feasibly be provided in any other setting.  Prognosis:  Hours to day   PPS: 5%   Plan: Aggressive symptom management at the end of life. See symptom management plan.    Transition Planning:  Per Hospice Team/ CSW Spiritual Care and Bereavement Planning: Per Hospice Team/CSW/Chaplain    Time In: 1215 pm Time Out:1240  pm Total Time:  30 min Greater than 50%  of this time was spent counseling and coordinating care related to the above assessment and plan.  Signed by:    Leighton Ruff. Lovena Le, MD MBA The Palliative Medicine Team at [facility] Phone: [phone] Pager: [phone] ( Use team phone after hours)  Please contact Palliative Medicine Team phone at [phone] for questions and concerns.

## 2014-03-11 NOTE — Progress Notes (Addendum)
[place] Rm 3W01 [place] [facility] RN Visit- M. Wynetta Emery, RN  Request received from [facility] for evaluation for GIP level of care eligibility; pt has been followed by Neurology and PMT teams who feel at this time prognosis is very limited and today given change in respiratory status pt is too fragile to transfer out of the hospital. Pt seen at bedside approximately 12:30pm chart and pt information reviewed with [place] Director and eligibility confirmed.  -GIP related admission to [facility] diagnosis intracerebral hemorrhage (431, I61.9); pt is a DNR code status  At time of visit pt unresponsive to touch or voice, RR=16 shallow, oral secretions drooling out of  audible upper airway congestion noted; per MAR PRN IV Morphine, IV Ativan and scopolamine adjusted for symptom management -  Effectiveness of symptom management will be continuously re-evaluated to achieve optimum comfort. Spoke with son, Richardson Landry at bedside who voiced his understanding that his mom's time is very limited and shared his wish is for her to be comfortable. Richardson Landry aware [facility] team will continue to follow daily and collaborate with PMT attending. Please call [facility] @ [phone]-  with any hospice needs.   Thank you.  Danton Sewer, RN  MSN [place] Liaison   [phone])

## 2014-03-12 DIAGNOSIS — I610 Nontraumatic intracerebral hemorrhage in hemisphere, subcortical: Secondary | ICD-10-CM

## 2014-03-12 DIAGNOSIS — R52 Pain, unspecified: Secondary | ICD-10-CM

## 2014-03-12 MED ORDER — LORAZEPAM 2 MG/ML IJ SOLN
1.0000 mg | Freq: Four times a day (QID) | INTRAMUSCULAR | Status: DC | PRN
  Filled 2014-03-12: qty 1

## 2014-03-12 MED ORDER — MORPHINE SULFATE 2 MG/ML IJ SOLN
2.0000 mg | INTRAMUSCULAR | Status: DC
  Administered 2014-03-12 – 2014-03-13 (×9): 2 mg via INTRAVENOUS
  Filled 2014-03-12 (×9): qty 1

## 2014-03-12 MED ORDER — MORPHINE SULFATE 2 MG/ML IJ SOLN: 1.0000 mg | INTRAMUSCULAR | Status: DC

## 2014-03-12 NOTE — Progress Notes (Signed)
[place] Rm 6N19 [facility] RN Visit GIP related admission [facility] Dx: Intracerebral hemorrhage; Pt is a DNR code status  Return visit to collaborate with staff RN Arbie Cookey after medications changes initiated by Dr Lovena Le; pt seen in room RR=24, less audible upper airway congestion, jaw slack, mouth open w R facial droop; son Richardson Landry at bedside stated he had spoken to staff RN Arbie Cookey and was aware of medication changes (IV Morphine 2 mg scheduled) he repeated his main goal was for her last hours/days to be comfortable; Richardson Landry shared he had made several calls to friends to make them aware his mother had very limited time; he voiced he felt her current room on [address] would be inviting to visitors. Richardson Landry also shared his comfort in the fact that "whatever happened appears to have happened in her sleep" and he felt grateful for that. Richardson Landry stated that while this was difficult, he was not overwhelmed as he had sat with his wife at the end of her life 5 years ago. Active listening and support offered.  Richardson Landry shared he was grateful for the care provided by the [address] staff and felt his mother was comfortable on this visit.  Writer encouraged Richardson Landry to notify the staff with any concerns and reviewed current comfort medications in place; he is aware that the effectiveness of symptom management is on-going and continues to be evaluated by hospital staff RN, Dr Lovena Le, and the [facility] team.  [facility] follows daily; please call [facility] at [phone] for any hospice needs.   *Please call PMT Physician at [phone] with any symptom management needs and at time of death.      Danton Sewer, RN MSN [place] Liaison [phone]

## 2014-03-12 NOTE — Progress Notes (Signed)
Patient KN:[id number] Hornung      DOB: February 04, 1921      LPF:[id number]   Palliative Medicine Team at [facility] Progress Note    Subjective: Patient with some increased inspiratory work of breathing.  Oxygen was placed over night without order.  Removed this am.  Reviewed case with staff.  Son not at bedside. Will check in with him later today.  Plan to schedule morphine for increased work of breathing . Will have low threshold for continuous infusion if boluses are not helpful.     Filed Vitals:   03/12/14 0528  BP: 165/91  Pulse: 120  Temp: 100.6 F (38.1 C)  Resp: 21   Physical exam:  General: frail, mild-mod increased work of breathing Pupils not examined. MMM with increased secretions Chest decreased anteriorly clear CVS: tachycardic Abd: soft , not distended  Ext: warm, not mottled Neuro: comatose, GCS 3    Assessment and plan: 80 yr old s/p hypertensive ICH.  Family has opted for full comfort under the patient's hospice benefit.  Oxygen removed  .  Morphine will be scheduled due to noted increased work of breathing (even with oxygen)   1.  DNR , Comfort care, Hospice in place  2.  Anxiety : continue ativan scheduled  3. Dyspnea: schedule morphine   4.  Secretions: scop patch in place , atropine scheduled.   Time 730 am- 800 am   Shakesha Soltau L. Lovena Le, MD MBA The Palliative Medicine Team at [facility] Phone: [phone] Pager: [phone] ( Use team phone after hours)

## 2014-03-12 NOTE — Progress Notes (Addendum)
[place] Rm 6N19 [facility] RN Visit- M. Wynetta Emery, RN  -GIP related admission to [facility] diagnosis intracerebral hemorrhage (431, I61.9); pt is a DNR code status  Pt seen at bedside, no family present at time of initial visit; pt unresponsive to voice or touch, extremities flaccid with repositioning; pt very warm to touch T =100.6 (Ax);HR =120, RR=36, pt with some accessory neck muscle use/ increased WOB; upper airway congestion noted, spoke with staff RN Arbie Cookey, Morphine 2 mg IV had been given 30-40  minutes prior to writer's arrival with little effect; Discussed with staff RN Arbie Cookey and Dr Lovena Le who has adjusted  medications (scheduled IV Morphine) to address dyspnea/ increased respiratory effort. Pt continues to require ongoing evaluation to monitor for effectiveness of symptom management for optimum comfort at EOL.     [facility] continues to follow daily and collaborate with PMT attending.Please call [facility] @ [phone]- with any hospice needs. Thank you.  Danton Sewer, RN MSN [place] Liaison  [phone])

## 2014-03-12 NOTE — Progress Notes (Signed)
[facility] [place] Work Note: ([phone]) GIP related admission [facility] Dx: Intracerebral hemorrhage; Pt is a DNR code status   Chart reviewed and received report from [employer] and [place] . Met with briefly with Maureen Weiss at bedside. He expressed much appreciation for transfer to [address] which he feels made possible for patient's neighborhood friends to visit this morning. After spending most of Wednesday night and all day Thursday at hospital, he reports "last night is the best nights sleep I've had in a long time." He expressed being "at peace with where things are" and is very focused on patient's comfort and dignity. He shared he has been visited by Dr. Lovena Le and [facility] RN Lesleigh Noe today and feels patient is being kept comfortable. He continues to express appreciation for previous [facility] experience with his wife. He is aware of [facility] bereavement support and declines [facility] chaplain support. He reports no other needs at this time, his goal is "uninterrupted time" with his mother. Acknowledged and affirmed his goals, encouraged him to initiate contact if needs arise. He is aware [facility] weekend staff will see over weekend. Maureen Conte LCSW [phone]  Please contact attending at [phone] for symptom management needs and at time of death.  Please contact [facility] at [phone] for hospice needs.

## 2014-04-11 NOTE — Progress Notes (Signed)
Went to check pt @0221 , noted pt unresponsive,no respirations, no pulse, no heart or breath sounds auscultated.  Pronounced death(2 RN`s) by Probation officer and charge nurse Esaw Dace. MD and family notified.

## 2014-04-11 NOTE — Discharge Summary (Signed)
Death Summary  Payten Hobin AVW:[id number] DOB: 06-25-1920 DOA: 2014/03/15  PCP: Irven Shelling, MD PCP/Office notified: Will be notified.  Admit date: 03-15-14 Date of Death: March 17, 2014  Final Diagnoses:  Active Problems:   Intracerebral hemorrhage   History of present illness: 80 yr old with acute hypertensive intracerebral bleed found by her son in bed.  Patient was evaluated for comfort care and son elected to extubate to comfort. We were asked to assist with hospice in place care.   Hospital Course:  Patient was admitted to our service under hospice benefit.  Medication were adjusted to promote comfort.  Patient was found on March 18, 2023 to be without respirations and no heart beat was detected.  Time of death was  49 am. Son was to be contacted by nursing.   Time: 0221  Signed:    Shalawn Wynder L. Lovena Le, MD MBA The Palliative Medicine Team at [facility] Phone: [phone] Pager: [phone] ( Use team phone after hours)  ,

## 2014-04-11 DEATH — deceased

## 2016-04-26 IMAGING — CR DG CHEST 1V PORT
1 series · 1 of 1 positions shown · non-contrast
Comparison: 03/06/2014

CLINICAL DATA: Loss of consciousness.

EXAM:
PORTABLE CHEST - 1 VIEW

[AP]
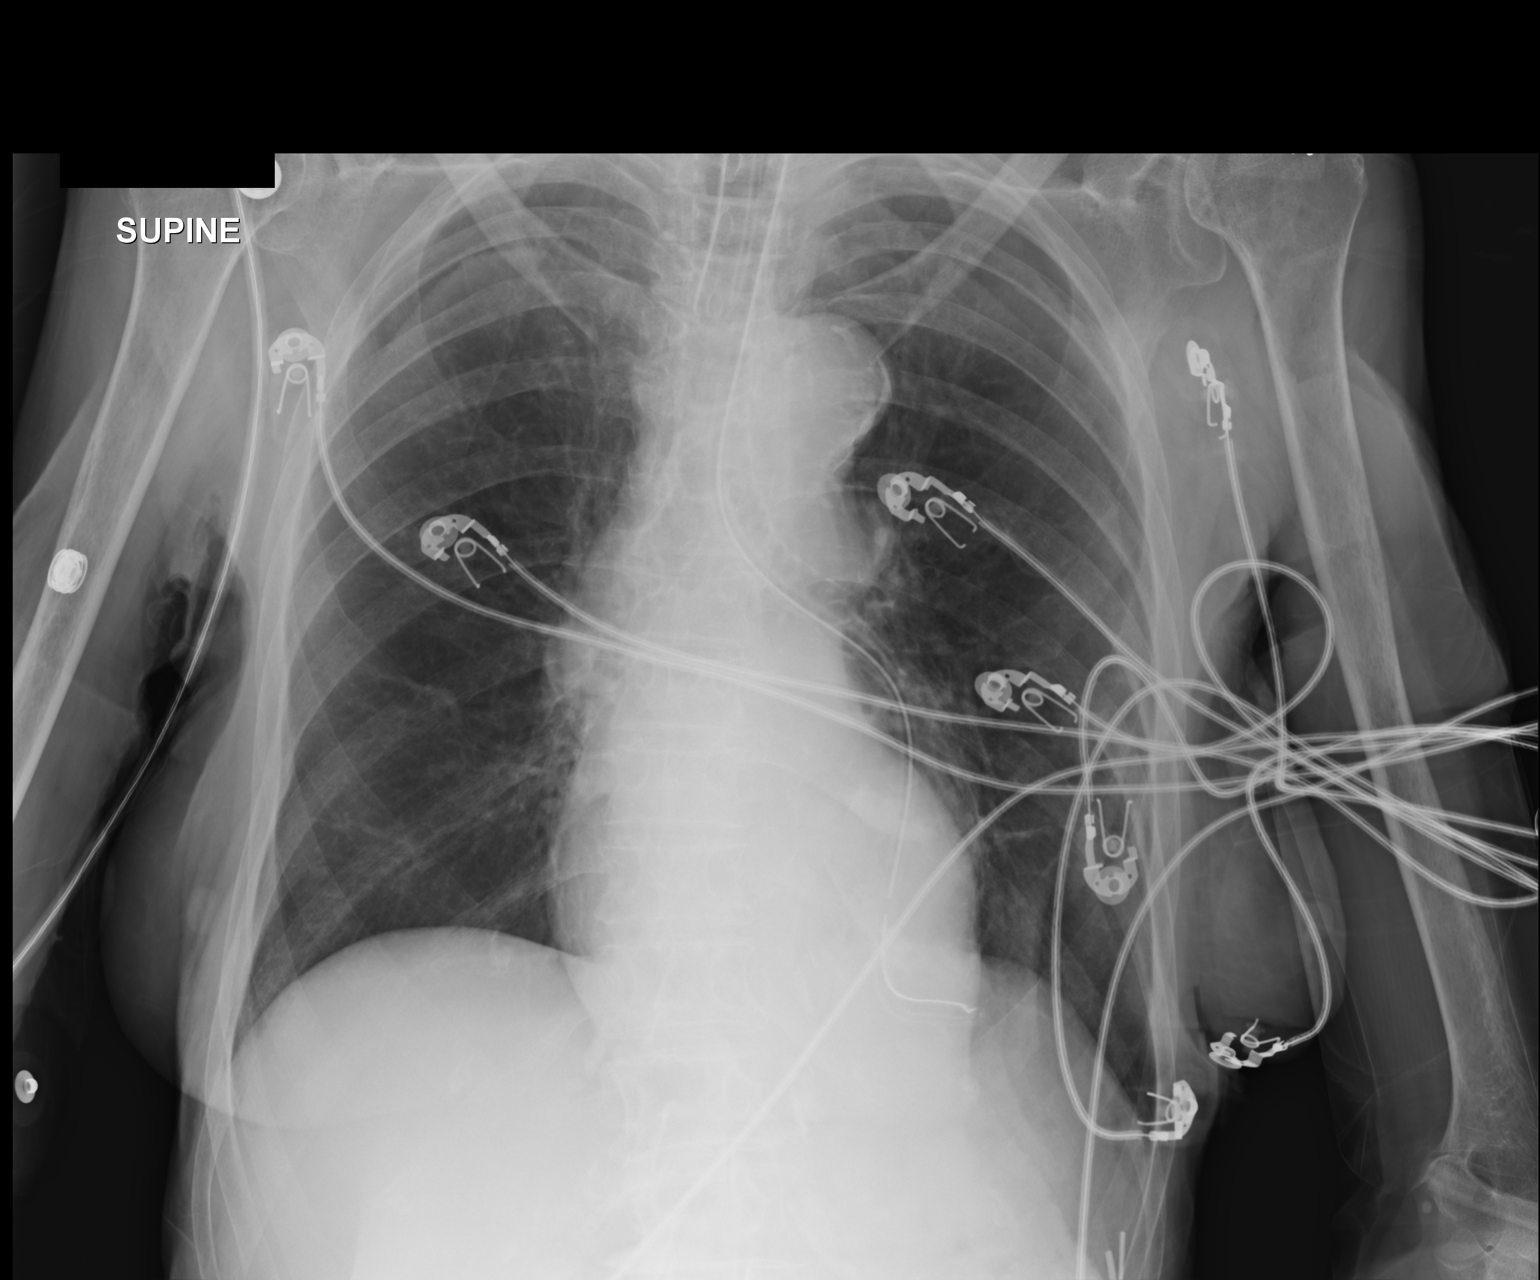

[1 of 1 positions shown; findings below may reference images not displayed]

FINDINGS: Endotracheal tube is in place with tip approximately 0.7 cm above
carina. Nasogastric tube is in place, with atypical course towards
the left lung base. This raises a question placement within the left
mainstem bronchus or a a large hiatal hernia. There is dense opacity
at the left lung base. No pneumothorax. Multiple skin folds overlie
the upper chest bilaterally.
IMPRESSION: 1. Atypical course of the nasogastric tube raising the question of 2
within the left mainstem bronchus and left lower lobe of the lung
versus hiatal hernia.
2. Critical Value/emergent results were called by telephone at the
time of interpretation on 03/06/2014 at [DATE] to Dr. EBADAT TIGER ,
who verbally acknowledged these results.

## 2016-04-26 IMAGING — CT CT HEAD W/O CM
2 series · 16 of 30 positions shown, 18 images · non-contrast
Comparison: None

CLINICAL DATA: Found unresponsive.

EXAM:
CT HEAD WITHOUT CONTRAST
TECHNIQUE: Contiguous axial images were obtained from the base of the skull
through the vertex without contrast.

[Series 201: head w/o, idose (1) · axial · non-contrast · 0.42mm/px · z∈[+1116,+1241]mm · 8 of 33 slices shown, 10 images]
[im 4/33  brain]
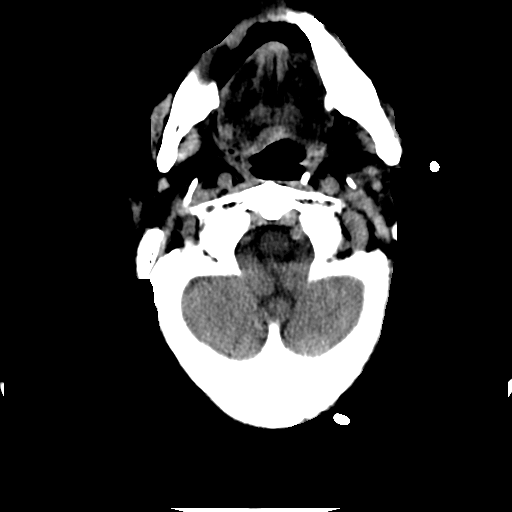
[im 4/33  bone]
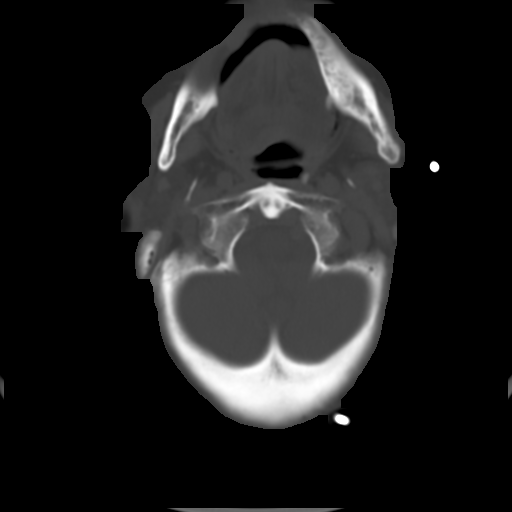
[im 8/33  brain]
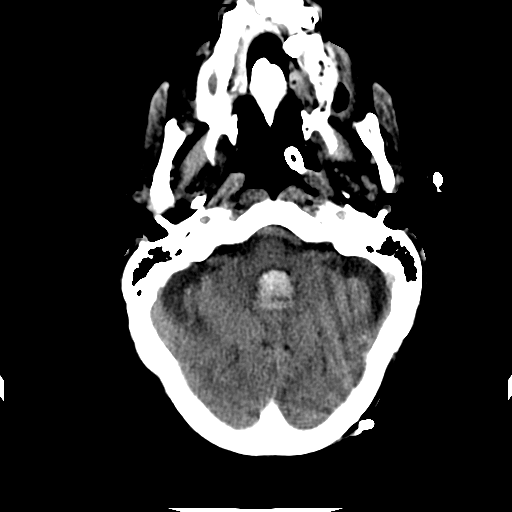
[im 11/33  brain]
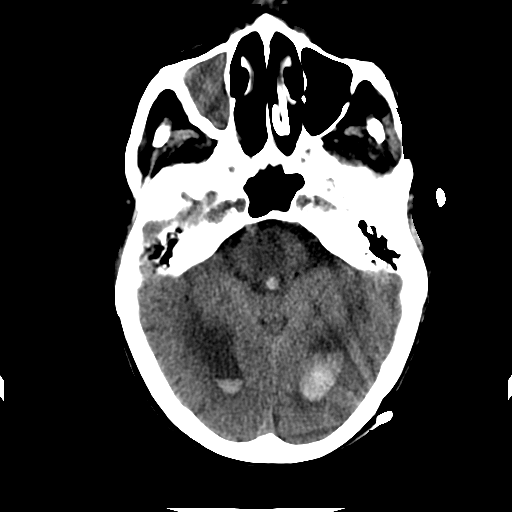
[im 15/33  brain]
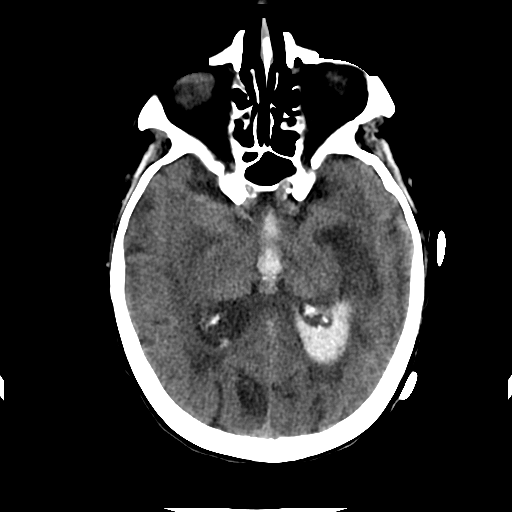
[im 18/33  brain]
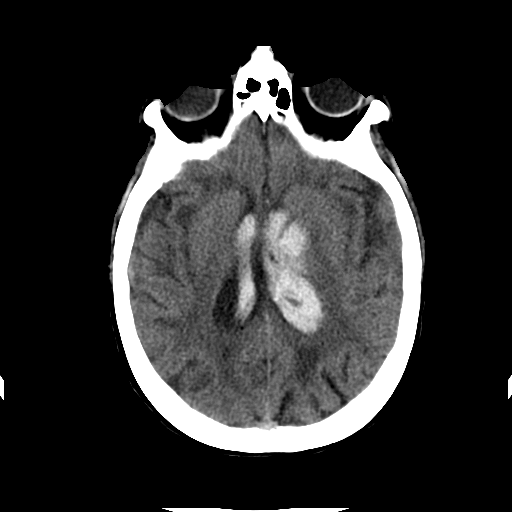
[im 18/33  bone]
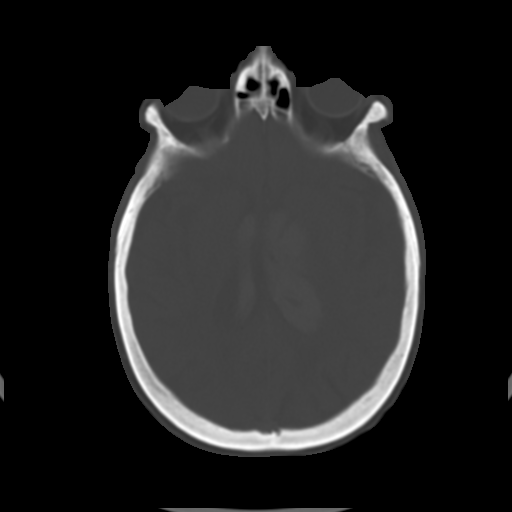
[im 22/33  brain]
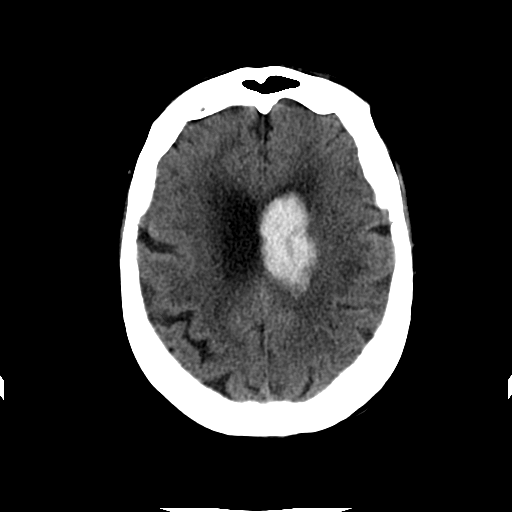
[im 25/33  brain]
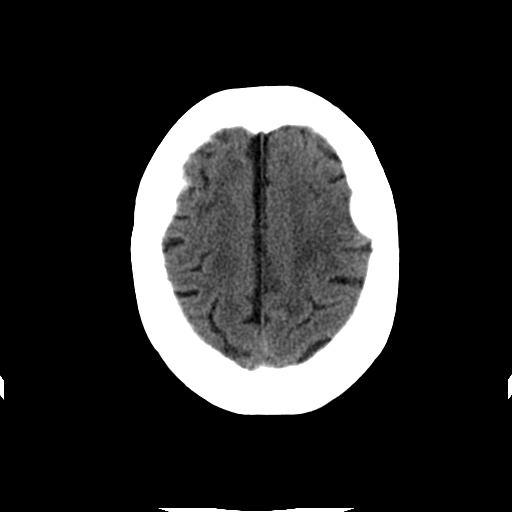
[im 29/33  brain]
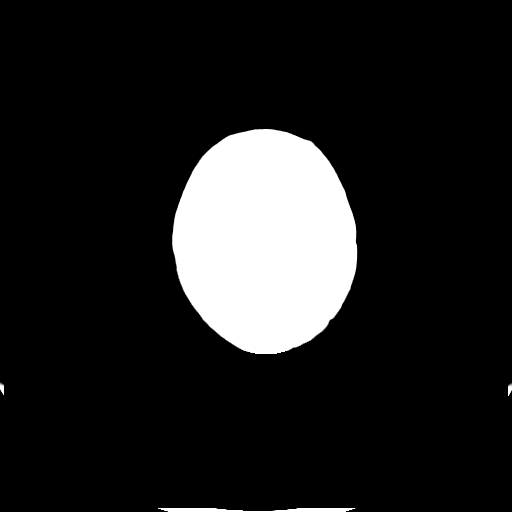

[Series 202: head w/o bone, idose (1) · axial · non-contrast · 0.42mm/px · z∈[+1114,+1244]mm · 8 of 66 slices shown]
[im 7/66  bone]
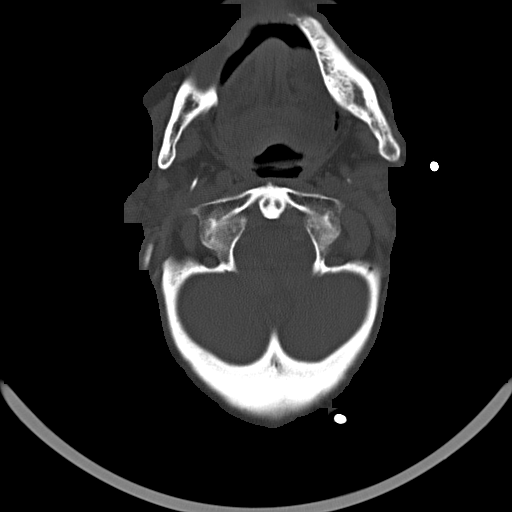
[im 14/66  bone]
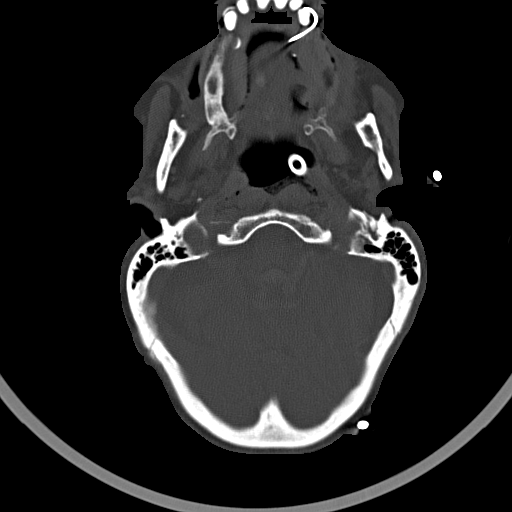
[im 21/66  bone]
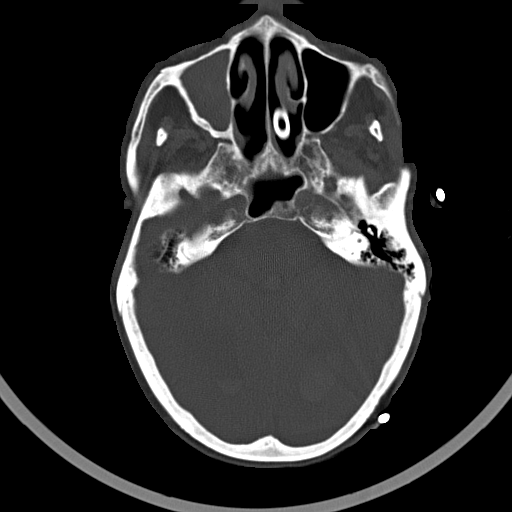
[im 28/66  bone]
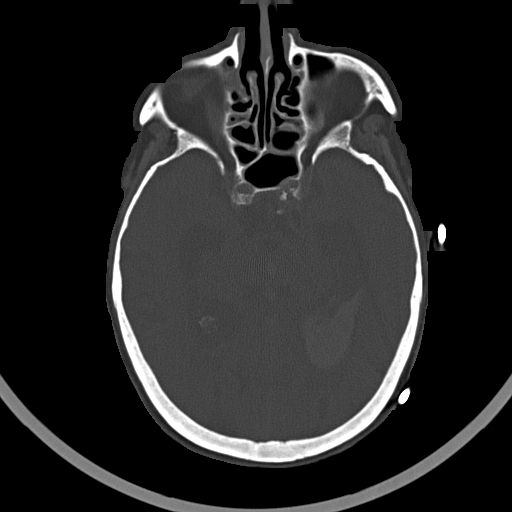
[im 38/66  bone]
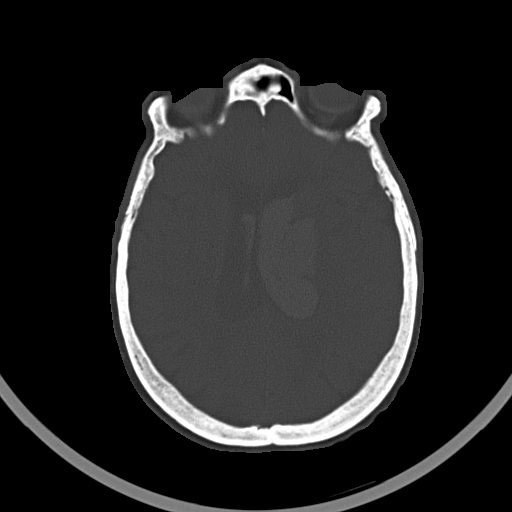
[im 45/66  bone]
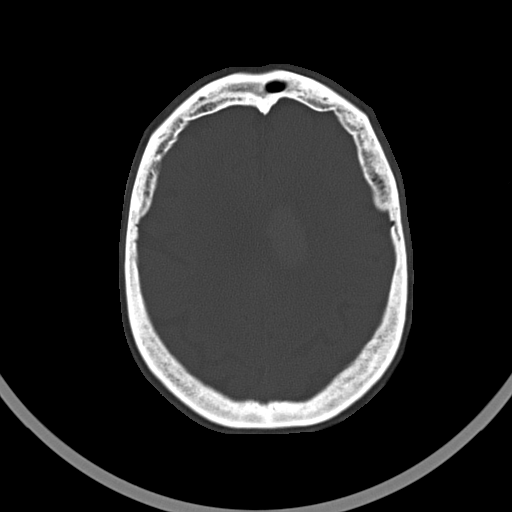
[im 52/66  bone]
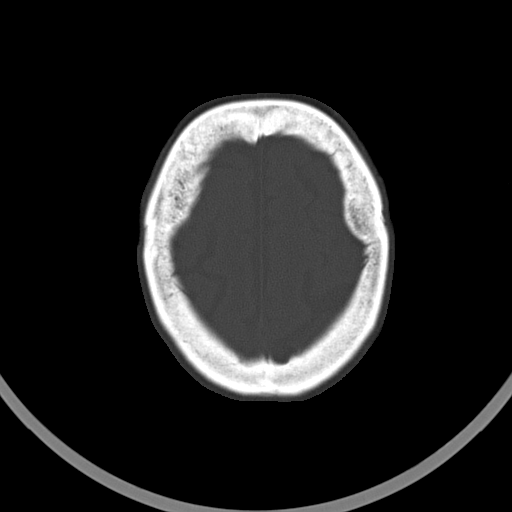
[im 59/66  bone]
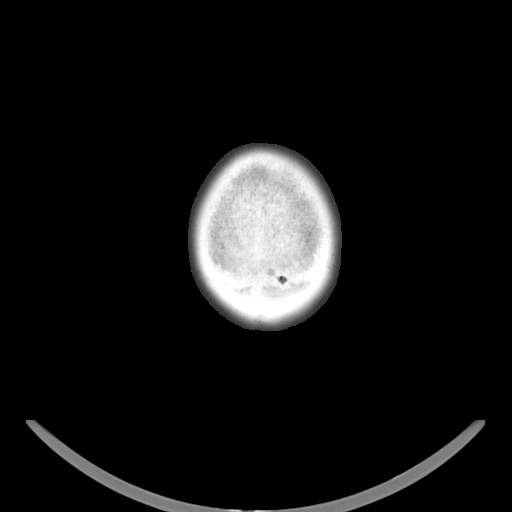

[16 of 30 positions shown; findings below may reference images not displayed]

FINDINGS: This study is positive for intracranial hemorrhage. There is a
parenchymal hemorrhage centered in the region of the left thalamus.
Parenchymal hemorrhage roughly measures 2.5 x 1.4 x 3.0 cm. There is
a large amount of blood within the ventricular system. The largest
amount of blood is in the left ventricle but there is blood within
the right ventricle, third ventricle and fourth ventricle. The
temporal horns are prominent for size. Patchy low-density throughout
the periventricular white matter suggests chronic small vessel
changes. Difficult to exclude old infarcts in the cerebellar
hemispheres. No evidence for a midline shift.

Complete opacification of the right maxillary sinus. Wall thicken in
the right maxillary sinus suggests chronic changes. A nasal tube has
been placed. No acute bone abnormality.
IMPRESSION: Left parenchymal hemorrhage centered near the left thalamus. There
is a large amount of intraventricular blood, particularly in the
left lateral ventricle.

Temporal horns are prominent and raise concern for developing
hydrocephalus.

Evidence for chronic small vessel ischemic changes.

Chronic right maxillary sinus disease.

Critical Value/emergent results were called by telephone at the time
of interpretation on 03/06/2014 at [DATE] to Dr. DJNELSON ESSES , who
verbally acknowledged these results.

## 2016-04-27 IMAGING — CR DG CHEST 1V PORT
1 series · 1 of 1 positions shown · non-contrast
Comparison: Portable exam 4043 hr compared to 03/06/2014

CLINICAL DATA: Intubation, followup

EXAM:
PORTABLE CHEST - 1 VIEW

[AP]
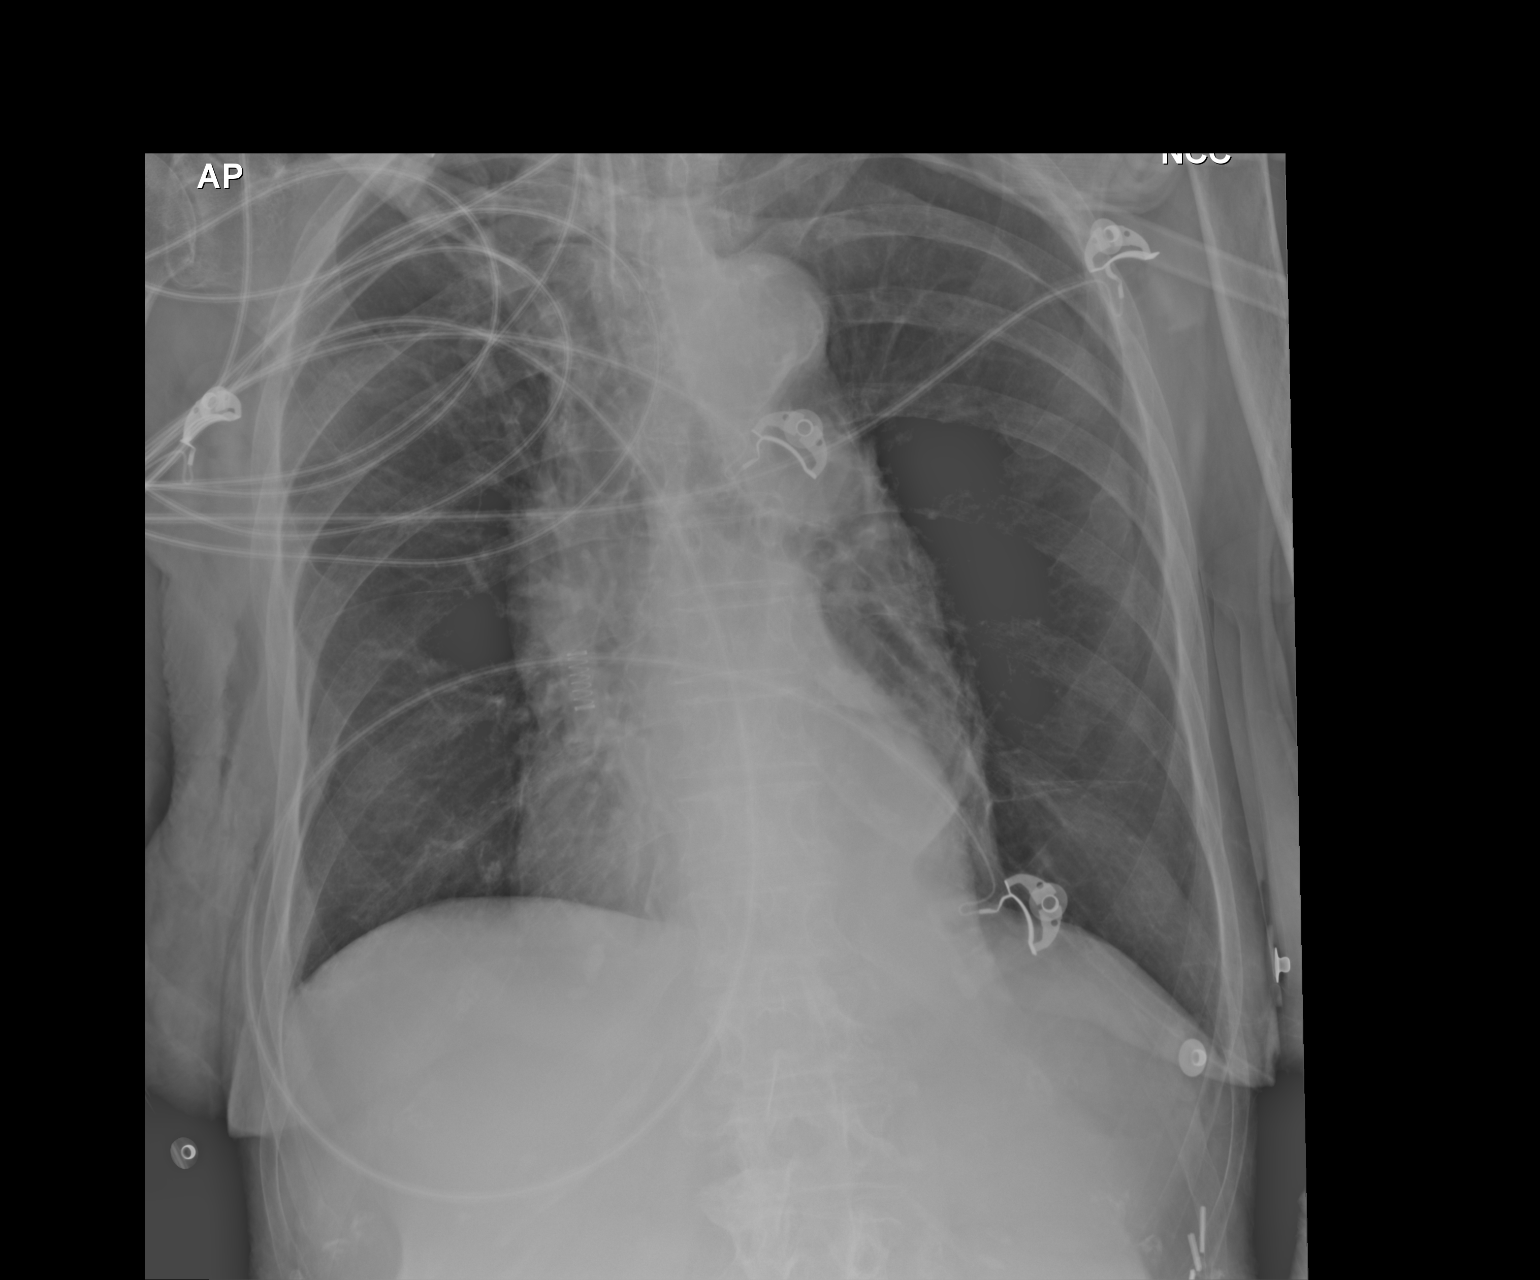

[1 of 1 positions shown; findings below may reference images not displayed]

FINDINGS: Overpenetrated exam limits assessment of lung fields.

Tip of endotracheal tube projects 5.3 cm above carina.

Interval removal of nasogastric tube.

Numerous EKG leads project over chest vertically RIGHT upper lobe.

Normal heart size and pulmonary vascularity.

Calcified tortuous thoracic aorta.

Prominent skin fold projects over LEFT chest.

No gross acute infiltrate, pleural effusion or pneumothorax.
IMPRESSION: Limited assessment of lung fields due to over penetration.

No definite acute abnormalities.
